# Patient Record
Sex: Female | Born: 1995 | Hispanic: No | Marital: Married
Health system: Southern US, Community
[De-identification: ages and names within clinical notes are randomized; demographics above are authoritative.]

## PROBLEM LIST (undated history)

## (undated) DIAGNOSIS — Q796 Ehlers-Danlos syndrome, unspecified: Secondary | ICD-10-CM

## (undated) DIAGNOSIS — N289 Disorder of kidney and ureter, unspecified: Secondary | ICD-10-CM

## (undated) DIAGNOSIS — G43909 Migraine, unspecified, not intractable, without status migrainosus: Secondary | ICD-10-CM

## (undated) DIAGNOSIS — I7300 Raynaud's syndrome without gangrene: Secondary | ICD-10-CM

## (undated) DIAGNOSIS — N200 Calculus of kidney: Secondary | ICD-10-CM

## (undated) DIAGNOSIS — E348 Other specified endocrine disorders: Secondary | ICD-10-CM

## (undated) DIAGNOSIS — Z9889 Other specified postprocedural states: Secondary | ICD-10-CM

## (undated) HISTORY — PX: KNEE ARTHROSCOPY: SHX127

## (undated) HISTORY — PX: TONSILLECTOMY: SUR1361

## (undated) HISTORY — PX: FOOT SURGERY: SHX648

## (undated) HISTORY — DX: Other specified postprocedural states: Z98.890

## (undated) HISTORY — PX: KNEE ARTHROSCOPY: SUR90

## (undated) HISTORY — PX: WISDOM TOOTH EXTRACTION: SHX21

---
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
## 2015-06-29 DIAGNOSIS — Q796 Ehlers-Danlos syndrome, unspecified: Secondary | ICD-10-CM | POA: Insufficient documentation

## 2015-08-09 DIAGNOSIS — M24662 Ankylosis, left knee: Secondary | ICD-10-CM | POA: Insufficient documentation

## 2015-08-09 DIAGNOSIS — M238X2 Other internal derangements of left knee: Secondary | ICD-10-CM | POA: Insufficient documentation

## 2015-08-09 DIAGNOSIS — M2392 Unspecified internal derangement of left knee: Secondary | ICD-10-CM | POA: Insufficient documentation

## 2016-02-22 DIAGNOSIS — R002 Palpitations: Secondary | ICD-10-CM | POA: Insufficient documentation

## 2016-03-21 DIAGNOSIS — H9312 Tinnitus, left ear: Secondary | ICD-10-CM | POA: Insufficient documentation

## 2016-06-13 DIAGNOSIS — M25562 Pain in left knee: Secondary | ICD-10-CM | POA: Insufficient documentation

## 2016-10-03 DIAGNOSIS — H9122 Sudden idiopathic hearing loss, left ear: Secondary | ICD-10-CM | POA: Insufficient documentation

## 2016-10-03 DIAGNOSIS — H60502 Unspecified acute noninfective otitis externa, left ear: Secondary | ICD-10-CM | POA: Insufficient documentation

## 2016-10-13 ENCOUNTER — Encounter (HOSPITAL_COMMUNITY): Payer: Self-pay

## 2016-10-13 DIAGNOSIS — H8110 Benign paroxysmal vertigo, unspecified ear: Secondary | ICD-10-CM | POA: Diagnosis not present

## 2016-10-13 DIAGNOSIS — I7300 Raynaud's syndrome without gangrene: Secondary | ICD-10-CM | POA: Diagnosis not present

## 2016-10-13 DIAGNOSIS — G43909 Migraine, unspecified, not intractable, without status migrainosus: Secondary | ICD-10-CM | POA: Insufficient documentation

## 2016-10-13 DIAGNOSIS — R42 Dizziness and giddiness: Secondary | ICD-10-CM | POA: Insufficient documentation

## 2016-10-13 LAB — I-STAT CHEM 8, ED
BUN: 10 mg/dL (ref 6–20)
CALCIUM ION: 1.18 mmol/L (ref 1.15–1.40)
CHLORIDE: 102 mmol/L (ref 101–111)
CREATININE: 0.6 mg/dL (ref 0.44–1.00)
Glucose, Bld: 117 mg/dL — ABNORMAL HIGH (ref 65–99)
HCT: 43 % (ref 36.0–46.0)
Hemoglobin: 14.6 g/dL (ref 12.0–15.0)
Potassium: 3.7 mmol/L (ref 3.5–5.1)
SODIUM: 139 mmol/L (ref 135–145)
TCO2: 25 mmol/L (ref 0–100)

## 2016-10-13 LAB — CBC WITH DIFFERENTIAL/PLATELET
Basophils Absolute: 0 10*3/uL (ref 0.0–0.1)
Basophils Relative: 0 %
EOS ABS: 0 10*3/uL (ref 0.0–0.7)
Eosinophils Relative: 0 %
HEMATOCRIT: 43.3 % (ref 36.0–46.0)
HEMOGLOBIN: 14.4 g/dL (ref 12.0–15.0)
LYMPHS ABS: 1.9 10*3/uL (ref 0.7–4.0)
LYMPHS PCT: 11 %
MCH: 30.4 pg (ref 26.0–34.0)
MCHC: 33.3 g/dL (ref 30.0–36.0)
MCV: 91.4 fL (ref 78.0–100.0)
MONOS PCT: 6 %
Monocytes Absolute: 1 10*3/uL (ref 0.1–1.0)
NEUTROS ABS: 14 10*3/uL — AB (ref 1.7–7.7)
NEUTROS PCT: 83 %
Platelets: 255 10*3/uL (ref 150–400)
RBC: 4.74 MIL/uL (ref 3.87–5.11)
RDW: 12.4 % (ref 11.5–15.5)
WBC: 16.9 10*3/uL — AB (ref 4.0–10.5)

## 2016-10-13 NOTE — ED Triage Notes (Signed)
Two weeks  Pt developed lt. Ear hearing loss and bleeding.  Pt. Went to ENT and they did not see anything wrong.  The pt. Has developed increased hearing loss, headache, dizziness and nausea.  Went to Dr. Jearld FentonByers today and he had no idea what was going on .  He is suppose to order an MRI and has not yet.  The symptoms became worse so the pt. Came here.  Skin is warm and dry.  Pt. Is alert and oriented X 4

## 2016-10-14 ENCOUNTER — Emergency Department (HOSPITAL_COMMUNITY): Payer: PRIVATE HEALTH INSURANCE

## 2016-10-14 ENCOUNTER — Emergency Department (HOSPITAL_COMMUNITY)
Admission: EM | Admit: 2016-10-14 | Discharge: 2016-10-14 | Disposition: A | Discharge status: Home / Self Care | Payer: PRIVATE HEALTH INSURANCE | Attending: Emergency Medicine | Admitting: Emergency Medicine

## 2016-10-14 DIAGNOSIS — G43909 Migraine, unspecified, not intractable, without status migrainosus: Secondary | ICD-10-CM

## 2016-10-14 DIAGNOSIS — H8110 Benign paroxysmal vertigo, unspecified ear: Secondary | ICD-10-CM

## 2016-10-14 HISTORY — DX: Calculus of kidney: N20.0

## 2016-10-14 HISTORY — DX: Other specified endocrine disorders: E34.8

## 2016-10-14 HISTORY — DX: Raynaud's syndrome without gangrene: I73.00

## 2016-10-14 HISTORY — DX: Ehlers-Danlos syndrome, unspecified: Q79.60

## 2016-10-14 LAB — URINALYSIS, ROUTINE W REFLEX MICROSCOPIC
BILIRUBIN URINE: NEGATIVE
Glucose, UA: NEGATIVE mg/dL
HGB URINE DIPSTICK: NEGATIVE
KETONES UR: NEGATIVE mg/dL
Leukocytes, UA: NEGATIVE
NITRITE: NEGATIVE
PH: 7 (ref 5.0–8.0)
Protein, ur: NEGATIVE mg/dL
Specific Gravity, Urine: 1.025 (ref 1.005–1.030)

## 2016-10-14 LAB — PREGNANCY, URINE: Preg Test, Ur: NEGATIVE

## 2016-10-14 MED ORDER — DIPHENHYDRAMINE HCL 50 MG/ML IJ SOLN
25.0000 mg | Freq: Once | INTRAMUSCULAR | Status: AC
  Administered 2016-10-14: 25 mg via INTRAVENOUS
  Filled 2016-10-14: qty 1

## 2016-10-14 MED ORDER — PROCHLORPERAZINE EDISYLATE 5 MG/ML IJ SOLN
10.0000 mg | Freq: Once | INTRAMUSCULAR | Status: AC
  Administered 2016-10-14: 10 mg via INTRAVENOUS
  Filled 2016-10-14: qty 2

## 2016-10-14 MED ORDER — SODIUM CHLORIDE 0.9 % IV BOLUS (SEPSIS)
1000.0000 mL | Freq: Once | INTRAVENOUS | Status: AC
  Administered 2016-10-14: 1000 mL via INTRAVENOUS

## 2016-10-14 MED ORDER — GADOBENATE DIMEGLUMINE 529 MG/ML IV SOLN
15.0000 mL | Freq: Once | INTRAVENOUS | Status: AC | PRN
  Administered 2016-10-14: 13 mL via INTRAVENOUS

## 2016-10-14 MED ORDER — MECLIZINE HCL 25 MG PO TABS: 25.0000 mg | ORAL_TABLET | Freq: Three times a day (TID) | ORAL | 0 refills | Status: DC | PRN

## 2016-10-14 NOTE — ED Notes (Signed)
Pt transported to MRI with mother to answer medical review questions.

## 2016-10-14 NOTE — ED Notes (Signed)
Assisted to the restroom  Urine collected

## 2016-10-14 NOTE — ED Provider Notes (Signed)
[facility] DE[facility] Provider Note   CSN: [id number] Arrival date & time: 10/13/16  1556     History   Chief Complaint Chief Complaint  Patient presents with  . Hearing Loss  . Dizziness  . Headache    HPI Debbie Christensen is a 21 y.o. female.  21 year old female with a history of Ehlers-Danlos presents to the emergency department for evaluation of dizziness. Patient has had constant dizziness over the past 2 weeks. She feels as though the room is spinning to her left. She has had intermittent nausea as well as sporadic vomiting. 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 She has noted an associated right parietal headache characterized as "someone stabbing me with an ice pick". Patient further reports hearing loss in her left ear. She denies any recent head injury or trauma. No history of similar migraines. She has not had associated fever, extremity numbness or paresthesias, extremity weakness. She has seen ENT with reassuring evaluations. She has trialed a course of steroids without improvement in her dizziness. Steroid course completed this morning.      Past Medical History:  Diagnosis Date  . Ehlers-Danlos disease   . Kidney calculi   . Pineal gland cyst   . Raynaud's disease     There are no active problems to display for this patient.   Past Surgical History:  Procedure Laterality Date  . FOOT SURGERY Right   . KNEE ARTHROSCOPY     6 bilateral knee surgeries  . TONSILLECTOMY    . WISDOM TOOTH EXTRACTION      OB History    No data available       Home Medications    Prior to Admission medications   Medication Sig Start Date End Date Taking? Authorizing Provider  meclizine (ANTIVERT) 25 MG tablet Take 1 tablet (25 mg total) by mouth 3 (three) times daily as needed for dizziness. 10/14/16   Antony Madura, PA-C    Family History No family history on file.  Social History Social History  Substance  Use Topics  . Smoking status: Smoker, Current Status Unknown  . Smokeless tobacco: Never Used  . Alcohol use No     Allergies   Tape   Review of Systems Review of Systems Ten systems reviewed and are negative for acute change, except as noted in the HPI.    Physical Exam Updated Vital Signs BP 106/72 (BP Location: Left Arm)   Pulse 66   Temp 98.9 F (37.2 C) (Oral)   Resp 16   Ht 5\' 4"  (1.626 m)   Wt 63.5 kg (140 lb)   LMP  (LMP Unknown)   SpO2 99%   BMI 24.03 kg/m   Physical Exam  Constitutional: She is oriented to person, place, and time. She appears well-developed and well-nourished. No distress.  Nontoxic and in NAD  HENT:  Head: Normocephalic and atraumatic.  Mouth/Throat: Oropharynx is clear and moist.  Symmetric rise of the uvula with phonation  Eyes: Conjunctivae and EOM are normal. Pupils are equal, round, and reactive to light. No scleral icterus.  Neck: Normal range of motion.  No meningismus  Cardiovascular: Normal rate, regular rhythm and intact distal pulses.   Pulmonary/Chest: Effort normal. No respiratory distress.  Musculoskeletal: Normal range of motion.  Neurological: She is alert and oriented to person, place, and time. No cranial nerve deficit. She exhibits normal muscle tone. Coordination normal.  GCS 15. Speech is goal oriented. No  cranial nerve deficits appreciated; symmetric eyebrow raise, no facial drooping, tongue midline. Patient has equal grip strength bilaterally with 5/5 strength against resistance in all major muscle groups bilaterally. Sensation to light touch intact. Patient moves extremities without ataxia.  Skin: Skin is warm and dry. No rash noted. She is not diaphoretic. No erythema. No pallor.  Psychiatric: She has a normal mood and affect. Her behavior is normal.  Nursing note and vitals reviewed.    ED Treatments / Results  Labs (all labs ordered are listed, but only abnormal results are displayed) Labs Reviewed  CBC WITH  DIFFERENTIAL/PLATELET - Abnormal; Notable for the following:       Result Value   WBC 16.9 (*)    Neutro Abs 14.0 (*)    All other components within normal limits  URINALYSIS, ROUTINE W REFLEX MICROSCOPIC - Abnormal; Notable for the following:    APPearance CLOUDY (*)    All other components within normal limits  I-STAT CHEM 8, ED - Abnormal; Notable for the following:    Glucose, Bld 117 (*)    All other components within normal limits  PREGNANCY, URINE    EKG  EKG Interpretation None       Radiology Mr Laqueta JeanBrain W And Wo Contrast  Result Date: 10/14/2016 CLINICAL DATA:  Initial evaluation for acute headache, dizziness. Hearing loss in left the ear. EXAM: MRI HEAD WITHOUT AND WITH CONTRAST TECHNIQUE: Multiplanar, multiecho pulse sequences of the brain and surrounding structures were obtained without and with intravenous contrast. CONTRAST:  13mL MULTIHANCE GADOBENATE DIMEGLUMINE 529 MG/ML IV SOLN COMPARISON:  None. FINDINGS: Brain: Cerebral volume within normal limits. No focal parenchymal signal abnormality. No abnormal foci of restricted diffusion to suggest acute or subacute ischemia. Gray-white matter differentiation maintained. No evidence for chronic infarction. No evidence for acute or chronic intracranial hemorrhage. No mass lesion, midline shift or mass effect. No hydrocephalus. No extra-axial fluid collection. Major dural sinuses are grossly patent. No abnormal enhancement. Pituitary gland within normal limits for age. Suprasellar region normal. Midline structures intact and normal. Vascular: Major intracranial vascular flow voids are well maintained and normal. Skull and upper cervical spine: Craniocervical junction within normal limits. Visualized upper cervical spine unremarkable. Bone marrow signal intensity within normal limits. No scalp soft tissue abnormality. Sinuses/Orbits: Globes and orbital soft tissues within normal limits. Paranasal sinuses are clear. No mastoid effusion.  Inner ear structures grossly normal. IMPRESSION: Normal brain MRI.  No acute intracranial process identified. Electronically Signed   By: Rise MuBenjamin  McClintock M.D.   On: 10/14/2016 04:19    Procedures Procedures (including critical care time)  Medications Ordered in ED Medications  sodium chloride 0.9 % bolus 1,000 mL (0 mLs Intravenous Stopped 10/14/16 0452)  prochlorperazine (COMPAZINE) injection 10 mg (10 mg Intravenous Given 10/14/16 0230)  diphenhydrAMINE (BENADRYL) injection 25 mg (25 mg Intravenous Given 10/14/16 0230)  gadobenate dimeglumine (MULTIHANCE) injection 15 mL (13 mLs Intravenous Contrast Given 10/14/16 0337)     Initial Impression / Assessment and Plan / ED Course  I have reviewed the triage vital signs and the nursing notes.  Pertinent labs & imaging results that were available during my care of the patient were reviewed by me and considered in my medical decision making (see chart for details).     21 year old female presents to the emergency department for evaluation of dizziness as well as headache and left-sided hearing loss. Dizziness has been persistent over the past 2 weeks. Patient also reports headache worse than prior migraines. The patient  has been followed by a neurologist previously. She is afebrile and without nuchal rigidity or meningismus. Neurologic exam nonfocal. No history of head injury or trauma.  MRI obtained which is negative for acute process. Patient has had improvement in her headache with migraine cocktail. Vitals stable. She also reports improvement in her dizziness. Given chronicity of symptoms, I do not believe further emergent workup is indicated. The patient is stable for discharge and follow-up with her primary care doctor as well as a neurologist. Meclizine prescribed for dizziness and return precautions given. Patient discharged in stable condition with no unaddressed concerns.  Vitals:   10/13/16 2044 10/14/16 0117 10/14/16 0359 10/14/16  0452  BP: 113/86 114/81 105/73 106/72  Pulse: 87 62 (!) 54 66  Resp: 20 12 16 16   Temp: 98.9 F (37.2 C)     TempSrc: Oral     SpO2: 98% 99% 98% 99%  Weight:      Height:        Final Clinical Impressions(s) / ED Diagnoses   Final diagnoses:  Migraine without status migrainosus, not intractable, unspecified migraine type  Benign paroxysmal positional vertigo, unspecified laterality    New Prescriptions New Prescriptions   MECLIZINE (ANTIVERT) 25 MG TABLET    Take 1 tablet (25 mg total) by mouth 3 (three) times daily as needed for dizziness.     Antony Madura, PA-C 10/14/16 0500    Palumbo, April, MD 10/14/16 [phone]

## 2016-10-14 NOTE — ED Notes (Signed)
Pt. currently at MRI .  

## 2016-10-15 ENCOUNTER — Other Ambulatory Visit: Payer: Self-pay | Admitting: Otolaryngology

## 2016-10-15 DIAGNOSIS — H905 Unspecified sensorineural hearing loss: Secondary | ICD-10-CM

## 2016-11-18 DIAGNOSIS — I7300 Raynaud's syndrome without gangrene: Secondary | ICD-10-CM | POA: Insufficient documentation

## 2017-01-20 DIAGNOSIS — G43009 Migraine without aura, not intractable, without status migrainosus: Secondary | ICD-10-CM | POA: Insufficient documentation

## 2017-02-24 DIAGNOSIS — E348 Other specified endocrine disorders: Secondary | ICD-10-CM | POA: Insufficient documentation

## 2017-05-05 DIAGNOSIS — M5116 Intervertebral disc disorders with radiculopathy, lumbar region: Secondary | ICD-10-CM | POA: Insufficient documentation

## 2017-05-15 DIAGNOSIS — M546 Pain in thoracic spine: Secondary | ICD-10-CM | POA: Insufficient documentation

## 2017-12-30 ENCOUNTER — Other Ambulatory Visit: Payer: Self-pay

## 2017-12-30 ENCOUNTER — Ambulatory Visit (INDEPENDENT_AMBULATORY_CARE_PROVIDER_SITE_OTHER): Payer: PRIVATE HEALTH INSURANCE

## 2017-12-30 ENCOUNTER — Ambulatory Visit (INDEPENDENT_AMBULATORY_CARE_PROVIDER_SITE_OTHER): Payer: PRIVATE HEALTH INSURANCE | Admitting: Podiatry

## 2017-12-30 ENCOUNTER — Encounter: Payer: Self-pay | Admitting: Podiatry

## 2017-12-30 VITALS — BP 109/80 | HR 124

## 2017-12-30 DIAGNOSIS — M76822 Posterior tibial tendinitis, left leg: Secondary | ICD-10-CM

## 2017-12-30 DIAGNOSIS — M7750 Other enthesopathy of unspecified foot: Secondary | ICD-10-CM

## 2017-12-30 DIAGNOSIS — M779 Enthesopathy, unspecified: Secondary | ICD-10-CM | POA: Diagnosis not present

## 2017-12-30 MED ORDER — MELOXICAM 15 MG PO TABS: 15.0000 mg | ORAL_TABLET | Freq: Every day | ORAL | 1 refills | Status: AC

## 2017-12-30 NOTE — Progress Notes (Signed)
    HPI: 22 year old female presenting today as a new patient with a chief complaint of sharp, stabbing pain to the left ankle that began 1.5 weeks ago. Walking increases the pain. She has not done anything for treatment. Patient is here for further evaluation and treatment.   Past Medical History:  Diagnosis Date  . Ehlers-Danlos disease   . Kidney calculi   . Pineal gland cyst   . Raynaud's disease        Physical Exam: General: The patient is alert and oriented x3 in no acute distress.  Dermatology: Skin is warm, dry and supple bilateral lower extremities. Negative for open lesions or macerations.  Vascular: Palpable pedal pulses bilaterally. No edema or erythema noted. Capillary refill within normal limits.  Neurological: Epicritic and protective threshold grossly intact bilaterally.   Musculoskeletal Exam: Pain on palpation noted to the posterior tibial tendon of the left foot. Range of motion within normal limits. Muscle strength 5/5 in all muscle groups bilateral lower extremities.  Radiographic Exam:  Normal osseous mineralization. Joint spaces preserved. No fracture or dislocation identified.    Assessment: 1. Posterior tibial tendinitis left   Plan of Care:  1. Patient was evaluated. Radiographs were reviewed today. 2. Injection of 0.5 mL Celestone Soluspan injected into the posterior tibial tendon sheath.  3. CAM boot dispensed. Weightbearing as tolerated.  4. Prescription for Meloxicam provided to patient.  5. Compression anklet dispensed.  6. Return to clinic in 3 weeks.   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    Felecia ShellingBrent M. Phillip Maffei, DPM [facility]  Dr. Felecia ShellingBrent M. Kyndall Amero, DPM    [address]                                        [place], [place] [address]                Office [phone]  Fax [phone]

## 2018-01-08 ENCOUNTER — Ambulatory Visit: Payer: PRIVATE HEALTH INSURANCE | Admitting: Podiatry

## 2018-01-13 ENCOUNTER — Other Ambulatory Visit: Payer: Self-pay | Admitting: Podiatry

## 2018-01-13 ENCOUNTER — Telehealth: Payer: Self-pay | Admitting: *Deleted

## 2018-01-13 DIAGNOSIS — T148XXA Other injury of unspecified body region, initial encounter: Secondary | ICD-10-CM

## 2018-01-13 DIAGNOSIS — M76822 Posterior tibial tendinitis, left leg: Secondary | ICD-10-CM

## 2018-01-13 MED ORDER — METHYLPREDNISOLONE 4 MG PO TBPK: ORAL_TABLET | ORAL | 0 refills | Status: DC

## 2018-01-13 NOTE — Telephone Encounter (Signed)
-----   Message from Felecia Shelling, DPM sent at 01/13/2018 12:25 PM EDT ----- Regarding: MRI left ankle Please order MRI left ankle w/out contrast.   Dx: posterior tibial tendon rupture left.  Thanks, Dr. Logan Bores

## 2018-01-13 NOTE — Telephone Encounter (Signed)
Orders to J. Quintana, RN for pre-cert, faxed to [facility]. 

## 2018-01-13 NOTE — Progress Notes (Signed)
PT tendinitis left  Rx Medrol dose pak

## 2018-01-27 ENCOUNTER — Ambulatory Visit: Payer: PRIVATE HEALTH INSURANCE

## 2018-01-27 ENCOUNTER — Telehealth: Payer: Self-pay | Admitting: Podiatry

## 2018-01-27 ENCOUNTER — Ambulatory Visit: Payer: PRIVATE HEALTH INSURANCE | Admitting: Podiatry

## 2018-01-27 DIAGNOSIS — M76822 Posterior tibial tendinitis, left leg: Secondary | ICD-10-CM

## 2018-01-27 NOTE — Telephone Encounter (Signed)
Pt has an Mri order at [facility] for this Friday. she got a letter in the mail yesterday asking was the MRI was medically necessary. Dr. Logan Bores would need to call the insurance company to do the peer to peer for a reconsideration.

## 2018-01-27 NOTE — Telephone Encounter (Signed)
Pt presented to office with denial paperwork from [facility] [employer], stating PEER TO PEER [id number] with in 14 days of the denial on 01/22/2018.

## 2018-01-27 NOTE — Telephone Encounter (Signed)
I informed pt, we were waiting for the correspondence from her insurance company, with the reason for denial and how to proceed with the reconsideration. Pt states she will bring that to the office.

## 2018-01-27 NOTE — Telephone Encounter (Signed)
I told pt I had process the reconsideration information for Dr. Logan Bores to call for PEER TO PEER and not to have the MRI until we had informed of the PA. Pt states understanding.

## 2018-01-27 NOTE — Telephone Encounter (Signed)
Pt called asked if we were able to process the insurance because she is scheduled for Friday.

## 2018-01-28 NOTE — Telephone Encounter (Signed)
Dr. Logan Bores states he reviewed the insurance correspondence concerning MRI denial, feels pt will benefit from 6 wks of PT and conservative therapy prior to the MRI.

## 2018-01-28 NOTE — Telephone Encounter (Signed)
[facility] Pre-cert [facility] states pt is scheduled for MRI Friday and needs pre-cert. 11:53am - I informed Melanie - [facility] pt had elected to accept Dr. Logan Bores recommendation for conservation therapy x 6 weeks to include PT, and to cancel MRI 01/29/2018.

## 2018-01-28 NOTE — Telephone Encounter (Signed)
Left message informing pt, Dr. Logan Bores had reviewed the insurance correspondence and had orders for her.

## 2018-01-28 NOTE — Addendum Note (Signed)
Addended by: Alphia Kava D on: 01/28/2018 11:11 AM   Modules accepted: Orders

## 2018-01-28 NOTE — Telephone Encounter (Signed)
I asked Debbie Christensen - [facility] to cance MRI 01/29/2018.

## 2018-01-28 NOTE — Telephone Encounter (Addendum)
I informed pt of Dr. Logan Bores review of insurance correspondence and orders, pt agreed. Pt states she would like to be referred to [facility] - Michaela Corner, PT. Faxed required form with note "Attn:  Pt request Michaela Corner, and demographics to [employer].

## 2018-01-29 ENCOUNTER — Encounter (HOSPITAL_COMMUNITY): Payer: Self-pay

## 2018-01-29 ENCOUNTER — Ambulatory Visit (HOSPITAL_COMMUNITY)
Admission: RE | Admit: 2018-01-29 | Discharge: 2018-01-29 | Disposition: A | Discharge status: Home / Self Care | Payer: PRIVATE HEALTH INSURANCE | Source: Ambulatory Visit | Attending: Podiatry | Admitting: Podiatry

## 2018-02-03 ENCOUNTER — Telehealth: Payer: Self-pay

## 2018-02-10 ENCOUNTER — Ambulatory Visit: Payer: PRIVATE HEALTH INSURANCE | Admitting: Podiatry

## 2018-02-22 ENCOUNTER — Ambulatory Visit (HOSPITAL_COMMUNITY): Payer: PRIVATE HEALTH INSURANCE

## 2018-02-22 ENCOUNTER — Encounter (HOSPITAL_COMMUNITY): Payer: Self-pay

## 2018-02-22 ENCOUNTER — Ambulatory Visit (HOSPITAL_COMMUNITY)
Admission: RE | Admit: 2018-02-22 | Discharge: 2018-02-22 | Disposition: A | Discharge status: Home / Self Care | Payer: PRIVATE HEALTH INSURANCE | Source: Ambulatory Visit | Attending: Obstetrics and Gynecology | Admitting: Obstetrics and Gynecology

## 2018-02-22 DIAGNOSIS — Q796 Ehlers-Danlos syndrome, unspecified: Secondary | ICD-10-CM | POA: Diagnosis not present

## 2018-02-22 DIAGNOSIS — Z3169 Encounter for other general counseling and advice on procreation: Secondary | ICD-10-CM | POA: Diagnosis not present

## 2018-02-22 DIAGNOSIS — Z315 Encounter for genetic counseling: Secondary | ICD-10-CM

## 2018-02-22 HISTORY — DX: Migraine, unspecified, not intractable, without status migrainosus: G43.909

## 2018-02-22 NOTE — Assessment & Plan Note (Signed)
Consultation:   Debbie Christensen is a 22 yo Caucasian female, G 0  P 0  LMP patient does not have menses due to continuous lo Estrin OCPs currently not pregnant here for pre-conceptual counseling at Dr. Kendra Ross' request secondary to:    1) Ehlers-Danlos Syndrome  PREVIOUS OBSTETRICAL HISTORY:  None     PREVIOUS GYN HISTORY:   Abnormal PAP - neg   GC - neg   Chlamydia - neg    Syphilis - neg   CAT - 11 x 28-30 x 7   Contraception - continuous Lo Estrin due to bleeding and cramping    PREVIOUS MEDICAL HISTORY:  DM - neg   HTN - neg   Asthma - neg   Thyroid - neg   Rheumatic Fever - neg    Heart - neg   Lung - neg   Liver - neg   Kidney - Age 18 - Nephrolithiasis   Epilepsy - neg    TB - neg   Herpes - neg   UTI - neg   Age 16 - Raynaud's Syndrome     PREVIOUS SURGICAL HISTORY:  1) Age 7 - Tonsillectomy without complications 2) 2012 - 2018 - Knee surgeries x 6 (left) one (right) without complications 3) 2014 - Right foot surgery with removal of accessory navicular bone without complications 4) 2014 - Wisdom teeth extraction without complications    MEDICATIONS:  Lo Estrin, Nortriptyline 100 mg QD       ALLERGIES/REACTIONS:  Toradol - neurologic symptoms, aphasia, tunnel vision      HABITS:  Smoking - neg   Drinking - neg   Drugs - neg    PSYCHOSOCIAL:  Engaged      PROFESSION:  Musical Therapist    FAMILY HISTORY:  DM - GF   HTN - neg   Twins - neg   Stillborns - neg    Birth Defects - neg   Mental Retardation - neg   Blood Dyscrasias - neg   Anesthesia Complications - neg    Genetic - neg          EHLERS-DANLOS SYNDROME:  Ehlers-Danlos syndrome (EDS) (also known as "Cutis hyperelastica") is a group of inherited connective tissue disorders, caused by a defect in the synthesis of collagen (a protein in connective tissue). The collagen in connective tissue helps tissues to resist deformation (decreases its elasticity). In the skin, muscles, ligaments,  blood vessels, and visceral organs collagen plays a very significant role and with increased elasticity, secondary to abnormal collagen, pathology results. Depending on the individual mutation, the severity of the syndrome can vary from mild to life-threatening. There is no cure and treatment is supportive, including close monitoring of cardiovascular system.  Mutations in the following can cause Ehlers-Danlos syndrome: . Fibrous proteins: COL1A1, COL1A2, COL3A1, COL5A1, COL5A2, and TNXB  . Enzymes: ADAMTS2, PLOD1  Mutations in these genes usually alter the structure, production, or processing of collagen or proteins that interact with collagen. Collagen provides structure and strength to connective tissue throughout the body. cA defect in collagen can weaken connective tissue in the skin, bones, blood vessels, and organs, resulting in the features of the disorder. Inheritance patterns depend on the type of Ehlers-Danlos syndrome. Most forms of the condition are inherited in an autosomal dominant pattern, which means only one of the two copies of the gene in question must be altered to cause the disorder. The minority are inherited in an autosomal recessive pattern, which means both copies of   the gene must be altered for a person to be affected by the condition. It can also be an individual (de novo or "sporadic") mutation.   Debbie Christensen has the Classical form of EDS. Classical EDS (cEDS)  Classical EDS is the second most-common form of the condition after Hypermobile EDS which represents 80-90 percent of all cases of Ehlers-Danlos. It was formerly known as Ehlers-Danlos Type 1 or 2This genetic disorder presents itself with extremes of hypermobile joints and stretchy and fragile skin.  The condition is inherited in an autosomal dominant fashion Additional symptoms of cEDS include: . Atrophic scarring . Significant bruising . Abnormal wound healing . Tissue fragility may result in hernias, rectal prolapse  and other complications . Cardiovascular abnormalities such as mitral valve prolapse or aortic root dilatation may occur . Pregnancy may be complicated by premature rupture of membranes   IMPRESSIONS:  1) 22 yo Caucasian female G0 Po with Classical Ehlers-Danlos Syndrome    RECOMMENDATIONS:  1) Maternal ECHO - in the absence of aortic root dilatation, pregnancy should be allowed.  Vaginal delivery would be preferred secondary to the issues of poor wound healing and hernia formation.  Uterine rupture is not associated with this type of Ehlers-Danlos.            45 minutes spent in face-to-face consultation with greater than 50% of the time spent in counseling.    Thank you for utilizing our ultrasound and consultative services.  If I may be of any further service, please do not hesitate to contact me.  Sincerely,   Jatavius Ellenwood L. Jennfier Abdulla, MD Maternal-Fetal Medicine   Copy of report sent to practitioner/clinic.  

## 2018-02-22 NOTE — ED Notes (Signed)
Pt in for preconception consult.  BP 109/72, p 100, weight 153.2 lb. Pt in with Dr. Perry MountJacobson.

## 2018-02-22 NOTE — Consult Note (Signed)
Consultation:   Debbie Christensen is a 22 yo Caucasian female, G 0  P 0  LMP patient does not have menses due to continuous lo Estrin OCPs currently not pregnant here for pre-conceptual counseling at Dr. Waynard Reeds' request secondary to:    1) Ehlers-Danlos Syndrome  PREVIOUS OBSTETRICAL HISTORY:  None     PREVIOUS GYN HISTORY:   Abnormal PAP - neg   GC - neg   Chlamydia - neg    Syphilis - neg   CAT - 11 x 28-30 x 7   Contraception - continuous Lo Estrin due to bleeding and cramping    PREVIOUS MEDICAL HISTORY:  DM - neg   HTN - neg   Asthma - neg   Thyroid - neg   Rheumatic Fever - neg    Heart - neg   Lung - neg   Liver - neg   Kidney - Age 60 - Nephrolithiasis   Epilepsy - neg    TB - neg   Herpes - neg   UTI - neg   Age 68 - Raynaud's Syndrome     PREVIOUS SURGICAL HISTORY:  1) Age 38 - Tonsillectomy without complications 2) 2012 - 2018 - Knee surgeries x 6 (left) one (right) without complications 3) 2014 - Right foot surgery with removal of accessory navicular bone without complications 4) 2014 - Wisdom teeth extraction without complications    MEDICATIONS:  Lo Estrin, Nortriptyline 100 mg QD       ALLERGIES/REACTIONS:  Toradol - neurologic symptoms, aphasia, tunnel vision      HABITS:  Smoking - neg   Drinking - neg   Drugs - neg    PSYCHOSOCIAL:  Engaged      PROFESSION:  Musical Therapist    FAMILY HISTORY:  DM - GF   HTN - neg   Twins - neg   Stillborns - neg    Birth Defects - neg   Mental Retardation - neg   Blood Dyscrasias - neg   Anesthesia Complications - neg    Genetic - neg          EHLERS-DANLOS SYNDROME:  Ehlers-Danlos syndrome (EDS) (also known as "Cutis hyperelastica") is a group of inherited connective tissue disorders, caused by a defect in the synthesis of collagen (a protein in connective tissue). The collagen in connective tissue helps tissues to resist deformation (decreases its elasticity). In the skin, muscles, ligaments,  blood vessels, and visceral organs collagen plays a very significant role and with increased elasticity, secondary to abnormal collagen, pathology results. Depending on the individual mutation, the severity of the syndrome can vary from mild to life-threatening. There is no cure and treatment is supportive, including close monitoring of cardiovascular system.  Mutations in the following can cause Ehlers-Danlos syndrome: . Fibrous proteins: COL1A1, COL1A2, COL3A1, COL5A1, COL5A2, and TNXB  . Enzymes: ADAMTS2, PLOD1  Mutations in these genes usually alter the structure, production, or processing of collagen or proteins that interact with collagen. Collagen provides structure and strength to connective tissue throughout the body. cA defect in collagen can weaken connective tissue in the skin, bones, blood vessels, and organs, resulting in the features of the disorder. Inheritance patterns depend on the type of Ehlers-Danlos syndrome. Most forms of the condition are inherited in an autosomal dominant pattern, which means only one of the two copies of the gene in question must be altered to cause the disorder. The minority are inherited in an autosomal recessive pattern, which means both copies of  the gene must be altered for a person to be affected by the condition. It can also be an individual (de novo or "sporadic") mutation.   Debbie Christensen has the Classical form of EDS. Classical EDS (cEDS)  Classical EDS is the second most-common form of the condition after Hypermobile EDS which represents 80-90 percent of all cases of Ehlers-Danlos. It was formerly known as Ehlers-Danlos Type 1 or 2This genetic disorder presents itself with extremes of hypermobile joints and stretchy and fragile skin.  The condition is inherited in an autosomal dominant fashion Additional symptoms of cEDS include: . Atrophic scarring . Significant bruising . Abnormal wound healing . Tissue fragility may result in hernias, rectal prolapse  and other complications . Cardiovascular abnormalities such as mitral valve prolapse or aortic root dilatation may occur . Pregnancy may be complicated by premature rupture of membranes   IMPRESSIONS:  1) 22 yo Caucasian female G0 Po with Classical Ehlers-Danlos Syndrome    RECOMMENDATIONS:  1) Maternal ECHO - in the absence of aortic root dilatation, pregnancy should be allowed.  Vaginal delivery would be preferred secondary to the issues of poor wound healing and hernia formation.  Uterine rupture is not associated with this type of Ehlers-Danlos.            45 minutes spent in face-to-face consultation with greater than 50% of the time spent in counseling.    Thank you for utilizing our ultrasound and consultative services.  If I may be of any further service, please do not hesitate to contact me.  Sincerely,   Patsi Searsobert L. , MD Maternal-Fetal Medicine   Copy of report sent to practitioner/clinic.

## 2018-09-10 MED FILL — NORTRIPTYLINE HCL CAP 25 MG: 25 | 30 days supply | Qty: 120 | Fill #0

## 2018-10-04 MED FILL — NORTRIPTYLINE HCL CAP 25 MG: 25 | 30 days supply | Qty: 120 | Fill #0

## 2018-10-06 ENCOUNTER — Emergency Department (HOSPITAL_COMMUNITY): Payer: No Typology Code available for payment source

## 2018-10-06 ENCOUNTER — Other Ambulatory Visit: Payer: Self-pay

## 2018-10-06 ENCOUNTER — Encounter (HOSPITAL_COMMUNITY): Payer: Self-pay | Admitting: Emergency Medicine

## 2018-10-06 ENCOUNTER — Emergency Department (HOSPITAL_COMMUNITY)
Admission: EM | Admit: 2018-10-06 | Discharge: 2018-10-07 | Disposition: A | Discharge status: Home / Self Care | Payer: No Typology Code available for payment source | Attending: Emergency Medicine | Admitting: Emergency Medicine

## 2018-10-06 DIAGNOSIS — Z79899 Other long term (current) drug therapy: Secondary | ICD-10-CM | POA: Diagnosis not present

## 2018-10-06 DIAGNOSIS — R200 Anesthesia of skin: Secondary | ICD-10-CM | POA: Diagnosis not present

## 2018-10-06 DIAGNOSIS — M25551 Pain in right hip: Secondary | ICD-10-CM | POA: Diagnosis not present

## 2018-10-06 LAB — I-STAT BETA HCG BLOOD, ED (MC, WL, AP ONLY): I-stat hCG, quantitative: <5 m[IU]/mL (ref <5)

## 2018-10-06 NOTE — ED Triage Notes (Signed)
Patient complaining of right side hip dislocation. Patient has EDS. Patient states that it popped out.

## 2018-10-07 MED ORDER — HYDROCODONE-ACETAMINOPHEN 5-325 MG PO TABS
2.0000 | ORAL_TABLET | Freq: Once | ORAL | Status: AC
  Administered 2018-10-07: 02:00:00 2 via ORAL
  Filled 2018-10-07: qty 2

## 2018-10-07 MED ORDER — HYDROCODONE-ACETAMINOPHEN 5-325 MG PO TABS: 1.0000 | ORAL_TABLET | Freq: Four times a day (QID) | ORAL | 0 refills | Status: DC | PRN

## 2018-10-07 MED FILL — HYDROCODON-APAP 5-325: 5-325 | 2 days supply | Qty: 5 | Fill #0

## 2018-10-07 NOTE — Discharge Instructions (Addendum)
Please use the knee immobilizer you have every day.  Please use the crutches every day and do not bear weight on your right leg.  Please follow-up with orthopedic doctor in 1 week

## 2018-10-07 NOTE — ED Notes (Signed)
EDP at bedside  

## 2018-10-07 NOTE — ED Provider Notes (Signed)
[facility] Provider Note   CSN: [id number] Arrival date & time: 10/06/18  2137     History   Chief Complaint Chief Complaint  Patient presents with  . Hip Injury    HPI Debbie Christensen is a 23 y.o. female.     The history is provided by the patient.  Hip Pain This is a new problem. The current episode started yesterday. The problem occurs constantly. The problem has not changed since onset.The symptoms are aggravated by walking. The symptoms are relieved by rest.   Patient with history of Ehlers-Danlos disease. She reports she dislocated her right hip while walking.  No falls or traumatic injury.  She reports this is happened before with other joints due to her disease.  She reports she did relocate the hip. She reports pain down her right leg and some numbness in her right foot Past Medical History:  Diagnosis Date  . Ehlers-Danlos disease   . Kidney calculi   . Migraines   . Pineal gland cyst   . Raynaud's disease     Patient Active Problem List   Diagnosis Date Noted  . Encounter for procreative genetic counseling and testing 02/22/2018  . Thoracic back pain 05/15/2017  . Lumbar disc herniation with radiculopathy 05/05/2017  . Pineal gland cyst 02/24/2017  . Migraine without aura and without status migrainosus, not intractable 01/20/2017  . Raynaud's disease 11/18/2016  . Dizziness 10/13/2016  . Acute noninfective otitis externa of left ear 10/03/2016  . Sudden left hearing loss 10/03/2016  . Acute pain of left knee 06/13/2016  . Tinnitus of left ear 03/21/2016  . Palpitations 02/22/2016  . Arthrofibrosis of knee joint, left 08/09/2015  . Chondral defect of left patella 08/09/2015  . Patellar malalignment syndrome, left 08/09/2015  . Ehlers-Danlos syndrome 06/29/2015  . Blurred vision, bilateral 09/19/2013    Past Surgical History:  Procedure Laterality Date  . FOOT SURGERY Right   . KNEE ARTHROSCOPY     6 bilateral knee  surgeries  . TONSILLECTOMY    . WISDOM TOOTH EXTRACTION       OB History   No obstetric history on file.      Home Medications    Prior to Admission medications   Medication Sig Start Date End Date Taking? Authorizing Provider  LO LOESTRIN FE 1 MG-10 MCG / 10 MCG tablet Take 1 tablet by mouth daily.  12/08/17  Yes [provider]  HYDROcodone-acetaminophen (NORCO/VICODIN) 5-325 MG tablet Take 1 tablet by mouth every 6 (six) hours as needed for severe pain. 10/07/18   Zadie RhineWickline, Derrika Ruffalo, MD  nortriptyline (PAMELOR) 25 MG capsule Take 75 mg by mouth at bedtime.  12/04/17   [provider]    Family History History reviewed. No pertinent family history.  Social History Social History   Tobacco Use  . Smoking status: Never Smoker  . Smokeless tobacco: Never Used  Substance Use Topics  . Alcohol use: No  . Drug use: No     Allergies   Ketorolac tromethamine and Tape   Review of Systems Review of Systems  Constitutional: Negative for fever.  Musculoskeletal: Positive for arthralgias.  Neurological: Positive for numbness.  All other systems reviewed and are negative.    Physical Exam Updated Vital Signs BP 115/75   Pulse 96   Temp 98.7 F (37.1 C) (Oral)   Resp 18   Ht 1.626 m (5\' 4" )   Wt 68 kg   SpO2 100%   BMI  25.75 kg/m   Physical Exam CONSTITUTIONAL: Well developed/well nourished HEAD: Normocephalic/atraumatic EYES: EOMI ENMT: Mucous membranes moist NECK: supple no meningeal signs CV: S1/S2 noted, no murmurs/rubs/gallops noted LUNGS: Lungs are clear to auscultation bilaterally, no apparent distress ABDOMEN: soft, nontender NEURO: Pt is awake/alert/appropriate, moves all extremitiesx4.  No facial droop.   EXTREMITIES: pulses normal/equal, full ROM, no deformities to right lower extremity, distal pulses equal intact, no tenderness to right knee/right ankle/right foot.  She has tenderness with range of motion of right hip.  No edema noted  to right lower extremity She is able to plantar/dorsi flex right foot SKIN: warm, color normal PSYCH: no abnormalities of mood noted, alert and oriented to situation   ED Treatments / Results  Labs (all labs ordered are listed, but only abnormal results are displayed) Labs Reviewed  I-STAT BETA HCG BLOOD, ED ([facility], [facility], [facility] ONLY)    EKG None  Radiology Dg Hip Unilat W Or Wo Pelvis 2-3 Views Right  Result Date: 10/06/2018 CLINICAL DATA:  Hip dislocation EXAM: DG HIP (WITH OR WITHOUT PELVIS) 2-3V RIGHT COMPARISON:  None. FINDINGS: There is no evidence of hip fracture or dislocation. There is no evidence of arthropathy or other focal bone abnormality. IMPRESSION: Negative. Electronically Signed   By: Rolm Baptise M.D.   On: 10/06/2018 23:27    Procedures Procedures   Medications Ordered in ED Medications  HYDROcodone-acetaminophen (NORCO/VICODIN) 5-325 MG per tablet 2 tablet (2 tablets Oral Given 10/07/18 0155)     Initial Impression / Assessment and Plan / ED Course  I have reviewed the triage vital signs and the nursing notes.  Pertinent  imaging results that were available during my care of the patient were reviewed by me and considered in my medical decision making (see chart for details).        Imaging is negative.  Patient has continued pain in the right leg.  Distal pulses intact.  She reports residual numbness to the right foot, but she does have strength in the foot. Plan is to place in a knee immobilizer, crutches and nonweightbearing until seen orthopedics.  She reports she has both a knee immobilizer and crutches at home.  Will discharge home with pain medications and also advise to [facility]  Final Clinical Impressions(s) / ED Diagnoses   Final diagnoses:  Pain of right hip joint    ED Discharge Orders         Ordered    HYDROcodone-acetaminophen (NORCO/VICODIN) 5-325 MG tablet  Every 6 hours PRN     10/07/18 0207           Ripley Fraise, MD 10/07/18  0236

## 2018-10-15 MED FILL — NORTRIPTYLINE HCL CAP 25 MG: 25 | 30 days supply | Qty: 120 | Fill #0

## 2018-10-21 MED FILL — LO LOESTRIN FE 1-10 TABLET: 1 MG-10 MCG | 84 days supply | Qty: 84 | Fill #0

## 2018-10-26 ENCOUNTER — Other Ambulatory Visit: Payer: Self-pay

## 2018-10-26 DIAGNOSIS — Z20822 Contact with and (suspected) exposure to covid-19: Secondary | ICD-10-CM

## 2018-10-27 MED FILL — NORTRIPTYLINE HCL CAP 25 MG: 25 | 30 days supply | Qty: 120 | Fill #0

## 2018-10-28 LAB — NOVEL CORONAVIRUS, NAA: SARS-CoV-2, NAA: NOT DETECTED

## 2018-11-08 MED FILL — BLISOVI FE 1/20 1-20 MG-MCG: 1-20 | 28 days supply | Qty: 28 | Fill #0

## 2018-11-10 MED FILL — SULFAMETHOXAZOLE-TMP SS TAB: 400-80 | 5 days supply | Qty: 10 | Fill #0

## 2018-11-20 MED FILL — NORTRIPTYLINE HCL CAP 25 MG: 25 | 30 days supply | Qty: 120 | Fill #1

## 2018-12-03 MED FILL — LO LOESTRIN FE 1-10 TABLET: 1 MG-10 MCG | 84 days supply | Qty: 84 | Fill #0

## 2018-12-21 MED FILL — NORTRIPTYLINE HCL CAP 25 MG: 25 | 30 days supply | Qty: 120 | Fill #2

## 2018-12-31 MED FILL — NORTRIPTYLINE HCL CAP 25 MG: 25 | 30 days supply | Qty: 120 | Fill #2

## 2019-01-25 MED FILL — METHOCARBAMOL 500 MG TABS: 500 | 5 days supply | Qty: 40 | Fill #0

## 2019-01-25 MED FILL — CEPHALEXIN 500 MG CAPSULE: 500 | 3 days supply | Qty: 12 | Fill #0

## 2019-01-25 MED FILL — ONDANSETRON HCL 4 MG TABLET: 4 | 13 days supply | Qty: 50 | Fill #0

## 2019-01-25 MED FILL — oxyCODONE HCL 5 MG TABS: 5 | 7 days supply | Qty: 40 | Fill #0

## 2019-01-27 MED FILL — CYCLOBENZAPRINE HCL 10 MG T: 10 | 13 days supply | Qty: 40 | Fill #0

## 2019-01-27 MED FILL — HYDROmorphone HCL 2 MG TABS: 2 | 5 days supply | Qty: 40 | Fill #0

## 2019-01-31 ENCOUNTER — Other Ambulatory Visit (HOSPITAL_COMMUNITY): Payer: Self-pay | Admitting: Specialist

## 2019-01-31 ENCOUNTER — Other Ambulatory Visit: Payer: Self-pay

## 2019-01-31 ENCOUNTER — Ambulatory Visit (HOSPITAL_BASED_OUTPATIENT_CLINIC_OR_DEPARTMENT_OTHER)
Admission: RE | Admit: 2019-01-31 | Discharge: 2019-01-31 | Disposition: A | Discharge status: Home / Self Care | Payer: No Typology Code available for payment source | Source: Ambulatory Visit | Attending: Specialist | Admitting: Specialist

## 2019-01-31 ENCOUNTER — Emergency Department (HOSPITAL_COMMUNITY): Payer: No Typology Code available for payment source

## 2019-01-31 ENCOUNTER — Encounter (HOSPITAL_COMMUNITY): Payer: Self-pay | Admitting: Emergency Medicine

## 2019-01-31 ENCOUNTER — Emergency Department (HOSPITAL_COMMUNITY)
Admission: EM | Admit: 2019-01-31 | Discharge: 2019-01-31 | Disposition: A | Discharge status: Home / Self Care | Payer: No Typology Code available for payment source | Attending: Emergency Medicine | Admitting: Emergency Medicine

## 2019-01-31 DIAGNOSIS — M7989 Other specified soft tissue disorders: Secondary | ICD-10-CM

## 2019-01-31 DIAGNOSIS — I82441 Acute embolism and thrombosis of right tibial vein: Secondary | ICD-10-CM

## 2019-01-31 DIAGNOSIS — M79604 Pain in right leg: Secondary | ICD-10-CM

## 2019-01-31 DIAGNOSIS — R000 Tachycardia, unspecified: Secondary | ICD-10-CM | POA: Diagnosis not present

## 2019-01-31 DIAGNOSIS — I82491 Acute embolism and thrombosis of other specified deep vein of right lower extremity: Secondary | ICD-10-CM | POA: Diagnosis not present

## 2019-01-31 HISTORY — DX: Ehlers-Danlos syndrome, unspecified: Q79.60

## 2019-01-31 HISTORY — DX: Disorder of kidney and ureter, unspecified: N28.9

## 2019-01-31 LAB — CBC
HCT: 40.1 % (ref 36.0–46.0)
Hemoglobin: 12.8 g/dL (ref 12.0–15.0)
MCH: 29.4 pg (ref 26.0–34.0)
MCHC: 31.9 g/dL (ref 30.0–36.0)
MCV: 92.2 fL (ref 80.0–100.0)
Platelets: 304 10*3/uL (ref 150–400)
RBC: 4.35 MIL/uL (ref 3.87–5.11)
RDW: 11.7 % (ref 11.5–15.5)
WBC: 7.1 10*3/uL (ref 4.0–10.5)
nRBC: 0 % (ref 0.0–0.2)

## 2019-01-31 LAB — BASIC METABOLIC PANEL
Anion gap: 9 (ref 5–15)
BUN: 13 mg/dL (ref 6–20)
CO2: 27 mmol/L (ref 22–32)
Calcium: 9.3 mg/dL (ref 8.9–10.3)
Chloride: 102 mmol/L (ref 98–111)
Creatinine, Ser: 0.58 mg/dL (ref 0.44–1.00)
GFR calc Af Amer: >60 mL/min (ref >60)
GFR calc non Af Amer: >60 mL/min (ref >60)
Glucose, Bld: 106 mg/dL — ABNORMAL HIGH (ref 70–99)
Potassium: 4 mmol/L (ref 3.5–5.1)
Sodium: 138 mmol/L (ref 135–145)

## 2019-01-31 MED ORDER — RIVAROXABAN (XARELTO) VTE STARTER PACK (15 & 20 MG): ORAL_TABLET | ORAL | 0 refills | Status: DC

## 2019-01-31 MED ORDER — RIVAROXABAN 15 MG PO TABS
15.0000 mg | ORAL_TABLET | Freq: Once | ORAL | Status: AC
  Administered 2019-01-31: 15 mg via ORAL
  Filled 2019-01-31: qty 1

## 2019-01-31 MED ORDER — RIVAROXABAN (XARELTO) EDUCATION KIT FOR DVT/PE PATIENTS
PACK | Freq: Once | Status: AC
  Administered 2019-01-31: 18:00:00
  Filled 2019-01-31: qty 1

## 2019-01-31 MED ORDER — SODIUM CHLORIDE 0.9 % IV BOLUS
1000.0000 mL | Freq: Once | INTRAVENOUS | Status: AC
  Administered 2019-01-31: 1000 mL via INTRAVENOUS

## 2019-01-31 MED ORDER — SODIUM CHLORIDE (PF) 0.9 % IJ SOLN
INTRAMUSCULAR | Status: AC
  Filled 2019-01-31: qty 50

## 2019-01-31 MED ORDER — IOHEXOL 350 MG/ML SOLN
100.0000 mL | Freq: Once | INTRAVENOUS | Status: AC | PRN
  Administered 2019-01-31: 100 mL via INTRAVENOUS

## 2019-01-31 MED ORDER — HYDROMORPHONE HCL 1 MG/ML IJ SOLN
1.0000 mg | Freq: Once | INTRAMUSCULAR | Status: AC
  Administered 2019-01-31: 1 mg via INTRAVENOUS
  Filled 2019-01-31: qty 1

## 2019-01-31 MED FILL — GABAPENTIN 300 MG CAPSULE: 300 | 30 days supply | Qty: 60 | Fill #0

## 2019-01-31 NOTE — Progress Notes (Signed)
Lower extremity venous has been completed.   Preliminary results in CV Proc.   Abram Sander 01/31/2019 2:10 PM

## 2019-01-31 NOTE — ED Provider Notes (Signed)
[facility] DEPT Provider Note   CSN: [id number] Arrival date & time: 01/31/19  1427     History   Chief Complaint Chief Complaint  Patient presents with   DVT    HPI Debbie Christensen is a 23 y.o. female with a hx of ehler's danlos syndrome, raynaud's disease, & recent R knee surgery 01/25/19 by Dr. Theda Sers who presents to the ED due to positive RLE DVT study outpatient today.  Patient states that following her surgery she has had pain to the right knee, it then started radiating to the calf/ankle area.  Pain is constant, worse with movement, mildly alleviated by hydromorphone but she is taking orally as needed. She thinks there may be some mild associated swelling. Given her symptoms she was scheduled for an outpatient ultrasound today which came back positive for DVT and she was sent to the emergency department.  She denies numbness, tingling, or weakness.  Denies redness/drainage from her surgical incision site, fever, or chills.  She denies chest pain, dyspnea, hemoptysis, recent recent long travel,  personal hx of cancer, or hx of DVT/PE. She takes OCPs. Denies hx of prior GI bleed or bleeding issues.   HPI  Past Medical History:  Diagnosis Date   EDS (Ehlers-Danlos syndrome)    Raynaud's disease    Renal disorder     There are no active problems to display for this patient.   Past Surgical History:  Procedure Laterality Date   KNEE ARTHROSCOPY     TONSILLECTOMY       OB History   No obstetric history on file.      Home Medications    Prior to Admission medications   Not on File    Family History No family history on file.  Social History Social History   Tobacco Use   Smoking status: Never Smoker  Substance Use Topics   Alcohol use: Never    Frequency: Never   Drug use: Never     Allergies   Adhesive [tape], Cinnamon, and Toradol [ketorolac tromethamine]   Review of Systems Review of Systems    Constitutional: Negative for chills and fever.  Respiratory: Negative for shortness of breath.   Cardiovascular: Positive for leg swelling. Negative for chest pain.  Gastrointestinal: Negative for abdominal pain, nausea and vomiting.  Musculoskeletal: Positive for myalgias.  Neurological: Negative for weakness and numbness.  All other systems reviewed and are negative.  Physical Exam Updated Vital Signs BP 124/85    Pulse (!) 107    Temp 98.4 F (36.9 C) (Oral)    Resp 20    Ht '5\' 4"'$  (1.626 m)    Wt 71.2 kg    SpO2 99%    BMI 26.95 kg/m   Physical Exam Vitals signs and nursing note reviewed.  Constitutional:      General: She is not in acute distress.    Appearance: She is not ill-appearing or toxic-appearing.  HENT:     Head: Normocephalic and atraumatic.  Neck:     Musculoskeletal: Neck supple.  Cardiovascular:     Rate and Rhythm: Regular rhythm. Tachycardia present.     Pulses: Normal pulses.          Dorsalis pedis pulses are 2+ on the right side and 2+ on the left side.       Posterior tibial pulses are 2+ on the right side and 2+ on the left side.     Heart sounds: Normal heart sounds.  Pulmonary:  Effort: Pulmonary effort is normal.     Breath sounds: Normal breath sounds.  Abdominal:     General: There is no distension.     Palpations: Abdomen is soft.     Tenderness: There is no abdominal tenderness.  Musculoskeletal:     Comments: Lower extremities: R anterior knee- surgical site w/ sutures in place, no significant erythema/warmth/drainage. R knee & R lower leg mildly swollen, no significant pitting edema.. Patient has intact AROM to bilateral hips/ankles, R knee ROM deferred secondary to immobilization per orthopedic surgery team. Tender to palpation to the R knee & the R calf diffusely. NVI distally. Compartments are soft.   Skin:    General: Skin is warm and dry.     Capillary Refill: Capillary refill takes less than 2 seconds.  Neurological:     Mental  Status: She is alert.     Comments: Alert. Clear speech. Sensation grossly intact to bilateral lower extremities. 5/5 strength with plantar/dorsiflexion bilaterally.  Psychiatric:        Mood and Affect: Mood normal.        Behavior: Behavior normal.    ED Treatments / Results  Labs (all labs ordered are listed, but only abnormal results are displayed) Labs Reviewed  BASIC METABOLIC PANEL - Abnormal; Notable for the following components:      Result Value   Glucose, Bld 106 (*)    All other components within normal limits  CBC    EKG None  Radiology Ct Angio Chest Pe W/cm &/or Wo Cm  Result Date: 01/31/2019 CLINICAL DATA:  Leg swelling and pain, known DVT in the right leg, tachycardia EXAM: CT ANGIOGRAPHY CHEST WITH CONTRAST TECHNIQUE: Multidetector CT imaging of the chest was performed using the standard protocol during bolus administration of intravenous contrast. Multiplanar CT image reconstructions and MIPs were obtained to evaluate the vascular anatomy. CONTRAST:  117m OMNIPAQUE IOHEXOL 350 MG/ML SOLN COMPARISON:  None. FINDINGS: Cardiovascular: Satisfactory opacification of the pulmonary arteries to the segmental level. No pulmonary artery filling defects are identified. Central pulmonary arteries are normal caliber. Insert normal aorta shared origin of the brachiocephalic and left common carotid artery. Proximal great vessels are unremarkable. Normal heart size. No pericardial effusion. Slight compressive mass effect on the right heart by a mild pectus deformity of the chest (Haller index 2.66). No CT evidence of right heart strain with a normal RV/LV ratio (0.53). Mediastinum/Nodes: No enlarged mediastinal, hilar or axillary lymph nodes. Thyroid gland, trachea, and esophagus demonstrate no significant findings. Lungs/Pleura: Dependent atelectasis posteriorly. No consolidation, features of edema, pneumothorax, or effusion. No suspicious pulmonary nodules or masses. Upper Abdomen: No  acute abnormalities present in the visualized portions of the upper abdomen. Musculoskeletal: No acute osseous abnormality or suspicious osseous lesion. Review of the MIP images confirms the above findings. IMPRESSION: No pulmonary artery embolism.  No acute intrathoracic process. Pectus deformity of the chest with slight compressive mass effect on the right heart. Electronically Signed   By: PLovena LeM.D.   On: 01/31/2019 17:20   Vas [place] Extremity Venous (dvt)  Result Date: 01/31/2019  Lower Venous Study Indications: Swelling, and Pain.  Comparison Study: no prior Performing Technologist: MAbram SanderRVS  Examination Guidelines: A complete evaluation includes B-mode imaging, spectral Doppler, color Doppler, and power Doppler as needed of all accessible portions of each vessel. Bilateral testing is considered an integral part of a complete examination. Limited examinations for reoccurring indications may be performed as noted.  +---------+---------------+---------+-----------+----------+--------------+  RIGHT  Compressibility Phasicity Spontaneity Properties Thrombus Aging  +---------+---------------+---------+-----------+----------+--------------+  CFV       Full            Yes       Yes                                    +---------+---------------+---------+-----------+----------+--------------+  SFJ       Full                                                             +---------+---------------+---------+-----------+----------+--------------+  FV Prox   Full                                                             +---------+---------------+---------+-----------+----------+--------------+  FV Mid    Full                                                             +---------+---------------+---------+-----------+----------+--------------+  FV Distal Full                                                             +---------+---------------+---------+-----------+----------+--------------+  PFV        Full                                                             +---------+---------------+---------+-----------+----------+--------------+  POP       Full            Yes       Yes                                    +---------+---------------+---------+-----------+----------+--------------+  PTV       None                                             Acute           +---------+---------------+---------+-----------+----------+--------------+  PERO                                                       Not visualized  +---------+---------------+---------+-----------+----------+--------------+   +----+---------------+---------+-----------+----------+--------------+  LEFT Compressibility Phasicity Spontaneity Properties Thrombus Aging  +----+---------------+---------+-----------+----------+--------------+  CFV                                                   Not visualized  +----+---------------+---------+-----------+----------+--------------+     Summary: Left: Findings consistent with acute deep vein thrombosis involving the left posterior tibial veins. No cystic structure found in the popliteal fossa.  *See table(s) above for measurements and observations.    Preliminary     Procedures Procedures (including critical care time)  Medications Ordered in ED Medications  rivaroxaban (XARELTO) Education Kit for DVT/PE patients (has no administration in time range)  sodium chloride (PF) 0.9 % injection (has no administration in time range)  Rivaroxaban (XARELTO) tablet 15 mg (has no administration in time range)  HYDROmorphone (DILAUDID) injection 1 mg (1 mg Intravenous Given 01/31/19 1607)  sodium chloride 0.9 % bolus 1,000 mL (0 mLs Intravenous Stopped 01/31/19 1659)  iohexol (OMNIPAQUE) 350 MG/ML injection 100 mL (100 mLs Intravenous Contrast Given 01/31/19 1651)     Initial Impression / Assessment and Plan / ED Course  I have reviewed the triage vital signs and the nursing notes.  Pertinent  labs & imaging results that were available during my care of the patient were reviewed by me and considered in my medical decision making (see chart for details).   Patient with recent right knee surgical procedure about a week ago presents to the emergency department due to outpatient venous duplex ultrasound of the right lower extremity being positive for posterior tibial vein DVT. Nontoxic appearing, resting comfortably, vitals notable for tachycardia. RLE exam w/o erythema/warmth- does not appear consistent w/ infectious process (septic joint/cellulitis). Mild swelling & TTP to the R knee/R lower leg. NVI distally. Exam not consistent w/ cerulea dolens/albicans. Given tachycardia basic labs & CTA was obtained to r/o PE- no PE. No acute intrathoracic process. Tachycardia improved/normalized. Appears appropriate for discharge home on Xarelto. Pharmacy consulted to provide patient education & coupon. I also personally discussed risks/benefits of medicine & reasons to return to the ER right away. I discussed results, treatment plan, need for follow-up, and return precautions with the patient & her husband @ bedside. Provided opportunity for questions, patient & her husband confirmed understanding and are in agreement with plan.   Findings and plan of care discussed with supervising physician Dr. Roslynn Amble who is in agreement.   Final Clinical Impressions(s) / ED Diagnoses   Final diagnoses:  Acute deep vein thrombosis (DVT) of tibial vein of right lower extremity [facility])    ED Discharge Orders         Ordered    Rivaroxaban 15 & 20 MG TBPK     01/31/19 1751           Amaryllis Dyke, PA-C 01/31/19 1808    Lucrezia Starch, MD 02/01/19 [phone]

## 2019-01-31 NOTE — ED Notes (Signed)
Patient transported to CT 

## 2019-01-31 NOTE — ED Triage Notes (Addendum)
Pt sent post vascular for positive clot to right calf.   Pt had right knee surgery on Tuesday.   Denies CP or SOB.

## 2019-01-31 NOTE — Discharge Instructions (Addendum)
You are seen in the emergency department today for a blood clot in your leg.  Your CT scan did not show a blood clot in your chest.  Your labs were reassuring.  We are sending you home with Xarelto, a blood thinner, to treat the clot.  Please take this as prescribed.  Please see the attached handout.  As discussed should you hit your head, get into an accident, have blood in your stool/urine, have uncontrollable bleeding, or any other concerns return to the ER immediately.  Please follow-up with your surgeon and your primary care provider within 3 days, return to the ER immediately for new or worsening symptoms including but not limited to chest pain, trouble breathing, worsening pain, numbness, tingling, increased swelling, or any other concerns.  Information on my medicine - XARELTO (rivaroxaban)  This medication education was reviewed with me or my healthcare representative as part of my discharge preparation.  The pharmacist that spoke with me during my hospital stay was:  Albino Bufford A, [place]? Xarelto was prescribed to treat blood clots that may have been found in the veins of your legs (deep vein thrombosis) or in your lungs (pulmonary embolism) and to reduce the risk of them occurring again.  What do you need to know about Xarelto? The starting dose is one 15 mg tablet taken TWICE daily with food for the FIRST 21 DAYS then on (enter date)  02/22/2019  the dose is changed to one 20 mg tablet taken ONCE A DAY with your evening meal.  DO NOT stop taking Xarelto without talking to the health care provider who prescribed the medication.  Refill your prescription for 20 mg tablets before you run out.  After discharge, you should have regular check-up appointments with your healthcare provider that is prescribing your Xarelto.  In the future your dose may need to be changed if your kidney function changes by a significant amount.  What do you do if you  miss a dose? If you are taking Xarelto TWICE DAILY and you miss a dose, take it as soon as you remember. You may take two 15 mg tablets (total 30 mg) at the same time then resume your regularly scheduled 15 mg twice daily the next day.  If you are taking Xarelto ONCE DAILY and you miss a dose, take it as soon as you remember on the same day then continue your regularly scheduled once daily regimen the next day. Do not take two doses of Xarelto at the same time.   Important Safety Information Xarelto is a blood thinner medicine that can cause bleeding. You should call your healthcare provider right away if you experience any of the following: ? Bleeding from an injury or your nose that does not stop. ? Unusual colored urine (red or dark brown) or unusual colored stools (red or black). ? Unusual bruising for unknown reasons. ? A serious fall or if you hit your head (even if there is no bleeding).  Some medicines may interact with Xarelto and might increase your risk of bleeding while on Xarelto. To help avoid this, consult your healthcare provider or pharmacist prior to using any new prescription or non-prescription medications, including herbals, vitamins, non-steroidal anti-inflammatory drugs (NSAIDs) and supplements.  This website has more information on Xarelto: [email or web address]

## 2019-01-31 NOTE — ED Notes (Signed)
Patient given water

## 2019-02-01 ENCOUNTER — Encounter (HOSPITAL_COMMUNITY): Payer: Self-pay | Admitting: Emergency Medicine

## 2019-02-01 MED FILL — XARELTO STARTER PACK: 15 & 20 | 30 days supply | Qty: 51 | Fill #0

## 2019-02-07 MED FILL — traMADol HCL 50 MG TABS: 50 | 10 days supply | Qty: 40 | Fill #0

## 2019-02-07 MED FILL — CYCLOBENZAPRINE HCL 10 MG T: 10 | 13 days supply | Qty: 40 | Fill #0

## 2019-02-25 MED FILL — XARELTO 20 MG TABLET: 20 | 30 days supply | Qty: 30 | Fill #0

## 2019-02-28 MED FILL — LO LOESTRIN FE 1-10 TABLET: 1 MG-10 MCG | 84 days supply | Qty: 84 | Fill #1

## 2019-03-30 MED FILL — XARELTO 20 MG TABLET: 20 | 30 days supply | Qty: 30 | Fill #1

## 2019-04-15 MED FILL — GABAPENTIN 300 MG CAPSULE: 300 | 90 days supply | Qty: 180 | Fill #0

## 2019-04-25 MED FILL — XARELTO 20 MG TABLET: 20 | 30 days supply | Qty: 30 | Fill #2

## 2019-05-24 ENCOUNTER — Encounter (HOSPITAL_COMMUNITY): Payer: Self-pay

## 2019-05-24 ENCOUNTER — Ambulatory Visit (HOSPITAL_COMMUNITY)
Admission: EM | Admit: 2019-05-24 | Discharge: 2019-05-24 | Disposition: A | Discharge status: Home / Self Care | Payer: No Typology Code available for payment source | Attending: Family Medicine | Admitting: Family Medicine

## 2019-05-24 ENCOUNTER — Other Ambulatory Visit: Payer: Self-pay

## 2019-05-24 ENCOUNTER — Ambulatory Visit (INDEPENDENT_AMBULATORY_CARE_PROVIDER_SITE_OTHER): Payer: No Typology Code available for payment source

## 2019-05-24 DIAGNOSIS — S42402A Unspecified fracture of lower end of left humerus, initial encounter for closed fracture: Secondary | ICD-10-CM | POA: Diagnosis not present

## 2019-05-24 NOTE — ED Provider Notes (Signed)
[facility]    CSN: [id number] Arrival date & time: 05/24/19  0820      History   Chief Complaint Chief Complaint  Patient presents with  . Fall  . Arm Injury    HPI Pasha Gadison is a 24 y.o. female.   HPI   Patient is here for arm pain.  She slipped and fell last night on hardwood floors.  She landed over on her left elbow.  She states that it is swollen, painful, and unable to move more than just a small amount.  No numbness or weakness into the hand.  No pain in other joints.  She states that she thinks she "bruised" her hip.  She is able to ambulate without pain. Patient has underlying Ehlers-Danlos syndrome.  She has never had problems with her bowels or fractures.  Past Medical History:  Diagnosis Date  . EDS (Ehlers-Danlos syndrome)   . Ehlers-Danlos disease   . Kidney calculi   . Migraines   . Pineal gland cyst   . Raynaud's disease   . Renal disorder     Patient Active Problem List   Diagnosis Date Noted  . Encounter for procreative genetic counseling and testing 02/22/2018  . Thoracic back pain 05/15/2017  . Lumbar disc herniation with radiculopathy 05/05/2017  . Pineal gland cyst 02/24/2017  . Migraine without aura and without status migrainosus, not intractable 01/20/2017  . Raynaud's disease 11/18/2016  . Dizziness 10/13/2016  . Acute noninfective otitis externa of left ear 10/03/2016  . Sudden left hearing loss 10/03/2016  . Acute pain of left knee 06/13/2016  . Tinnitus of left ear 03/21/2016  . Palpitations 02/22/2016  . Arthrofibrosis of knee joint, left 08/09/2015  . Chondral defect of left patella 08/09/2015  . Patellar malalignment syndrome, left 08/09/2015  . Ehlers-Danlos syndrome 06/29/2015  . Blurred vision, bilateral 09/19/2013    Past Surgical History:  Procedure Laterality Date  . FOOT SURGERY Right   . KNEE ARTHROSCOPY     6 bilateral knee surgeries  . KNEE ARTHROSCOPY    . TONSILLECTOMY    . WISDOM TOOTH  EXTRACTION      OB History   No obstetric history on file.      Home Medications    Prior to Admission medications   Medication Sig Start Date End Date Taking? Authorizing Provider  LO LOESTRIN FE 1 MG-10 MCG / 10 MCG tablet Take 1 tablet by mouth daily.  12/08/17   [provider]  nortriptyline (PAMELOR) 25 MG capsule Take 75 mg by mouth at bedtime.  12/04/17   [provider]  Rivaroxaban 15 & 20 MG TBPK Follow package directions: Take one 15mg  tablet by mouth twice a day. On day 22, switch to one 20mg  tablet once a day. Take with food. 01/31/19 05/24/19  Petrucelli, 02/02/19, PA-C    Family History Family History  Problem Relation Age of Onset  . Healthy Mother   . Thyroid disease Father     Social History Social History   Tobacco Use  . Smoking status: Never Smoker  . Smokeless tobacco: Never Used  Substance Use Topics  . Alcohol use: Never  . Drug use: Never     Allergies   Cinnamon, Ketorolac, and Tape   Review of Systems Review of Systems  Musculoskeletal: Positive for arthralgias.     Physical Exam Triage Vital Signs ED Triage Vitals  Enc Vitals Group     BP 05/24/19 0835 113/82  Pulse Rate 05/24/19 0835 (!) 106     Resp 05/24/19 0835 16     Temp 05/24/19 0835 97.9 F (36.6 C)     Temp Source 05/24/19 0835 Oral     SpO2 05/24/19 0835 96 %     Weight --      Height --      Head Circumference --      Peak Flow --      Pain Score 05/24/19 0836 8     Pain Loc --      Pain Edu? --      Excl. in [place]? --    No data found.  Updated Vital Signs BP 113/82 (BP Location: Left Arm)   Pulse (!) 106   Temp 97.9 F (36.6 C) (Oral)   Resp 16   SpO2 96%        Physical Exam Constitutional:      General: She is not in acute distress.    Appearance: Normal appearance. She is well-developed.     Comments: Appears uncomfortable.  Holds left arm close to body  HENT:     Head: Normocephalic and atraumatic.     Mouth/Throat:      Comments: Mask in place Eyes:     Conjunctiva/sclera: Conjunctivae normal.     Pupils: Pupils are equal, round, and reactive to light.  Cardiovascular:     Rate and Rhythm: Normal rate and regular rhythm.     Comments: By observation Pulmonary:     Effort: Pulmonary effort is normal. No respiratory distress.  Musculoskeletal:        General: Swelling, tenderness and signs of injury present. No deformity. Normal range of motion.     Cervical back: Normal range of motion.     Comments: Left upper extremity is examined.  Shoulder is nontender.  Limited range of motion secondary to elbow pain.  Elbow is tender over the olecranon process, medial and lateral epicondyles.  Patient resists any movement of elbow.  Wrist and hand exam is normal.  Compared with right upper extremity.  Skin:    General: Skin is warm and dry.  Neurological:     General: No focal deficit present.     Mental Status: She is alert.  Psychiatric:        Mood and Affect: Mood normal.     Comments: Pleasant, cooperative      UC Treatments / Results  Labs (all labs ordered are listed, but only abnormal results are displayed) Labs Reviewed - No data to display  EKG   Radiology DG Elbow Complete Left  Result Date: 05/24/2019 CLINICAL DATA:  Fall, elbow pain EXAM: LEFT ELBOW - COMPLETE 3+ VIEW COMPARISON:  None. FINDINGS: There is a left elbow joint effusion. No visible fracture, but concern for occult fracture. Joint spaces maintained. Soft tissues unremarkable. IMPRESSION: Left elbow joint effusion without visible fracture, but findings are concerning for occult fracture. Consider immobilization and repeat imaging in 1 week if symptoms persist. Electronically Signed   By: Rolm Baptise M.D.   On: 05/24/2019 09:14    Procedures Procedures (including critical care time)  Medications Ordered in UC Medications - No data to display  Initial Impression / Assessment and Plan / UC Course  I have reviewed the triage  vital signs and the nursing notes.  Pertinent labs & imaging results that were available during my care of the patient were reviewed by me and considered in my medical decision making (see chart for details).  Discussed with Dr Shon Baton of [facility].  He recommends patient be seen today and referred to their walk in clinic at noon Final Clinical Impressions(s) / UC Diagnoses   Final diagnoses:  Closed fracture of left elbow, initial encounter     Discharge Instructions     Go to your ortho office at noon    ED Prescriptions    None     PDMP not reviewed this encounter.   Eustace Moore, MD 05/24/19 1030

## 2019-05-24 NOTE — Discharge Instructions (Addendum)
Go to your ortho office at noon

## 2019-05-24 NOTE — ED Triage Notes (Signed)
Pt states she slipped on hardwood floors at home last night. Landed on left side. C/o left elbow/forearm pain and left hip pain. Denies head injury or LOC. Pt keeps left arm in "sling position" for comfort. +2 left radial pulse, neurovascular intact to hand/fingers.  Pt states she took home pregnancy test yesterday (negative) 2/2 nausea.

## 2019-05-26 MED FILL — LO LOESTRIN FE 1-10 TABLET: 1 MG-10 MCG | 84 days supply | Qty: 84 | Fill #2

## 2019-06-24 ENCOUNTER — Ambulatory Visit: Payer: No Typology Code available for payment source | Attending: Internal Medicine

## 2019-06-24 DIAGNOSIS — Z23 Encounter for immunization: Secondary | ICD-10-CM

## 2019-06-24 NOTE — Progress Notes (Signed)
   Covid-19 Vaccination Clinic  Name:  Debbie Christensen    MRN: [id number] DOB: 1996-01-15  06/24/2019  Debbie Christensen was observed post Covid-19 immunization for 30 minutes based on pre-vaccination screening without incident. She was provided with Vaccine Information Sheet and instruction to access the V-Safe system.   Debbie Christensen was instructed to call 911 with any severe reactions post vaccine: Marland Kitchen Difficulty breathing  . Swelling of face and throat  . A fast heartbeat  . A bad rash all over body  . Dizziness and weakness   Immunizations Administered    Name Date Dose VIS Date Route   Pfizer COVID-19 Vaccine 06/24/2019  9:01 AM 0.3 mL 03/18/2019 Intramuscular   Manufacturer: [employer], [employer]   Lot: [id number]   NDC: [address]-5

## 2019-06-25 ENCOUNTER — Ambulatory Visit: Payer: No Typology Code available for payment source

## 2019-07-20 ENCOUNTER — Ambulatory Visit: Payer: No Typology Code available for payment source | Attending: Internal Medicine

## 2019-07-20 DIAGNOSIS — Z23 Encounter for immunization: Secondary | ICD-10-CM

## 2019-07-20 NOTE — Progress Notes (Signed)
   Covid-19 Vaccination Clinic  Name:  Debbie Christensen    MRN: [id number] DOB: 03/26/1996  07/20/2019  [name] was observed post Covid-19 immunization for 15 minutes without incident. She was provided with Vaccine Information Sheet and instruction to access the V-Safe system.   [name] was instructed to call 911 with any severe reactions post vaccine: Marland Kitchen Difficulty breathing  . Swelling of face and throat  . A fast heartbeat  . A bad rash all over body  . Dizziness and weakness   Immunizations Administered    Name Date Dose VIS Date Route   Pfizer COVID-19 Vaccine 07/20/2019 11:16 AM 0.3 mL 03/18/2019 Intramuscular   Manufacturer: [employer], [employer]   Lot: [id number]   NDC: [address]-7

## 2019-11-02 ENCOUNTER — Emergency Department (HOSPITAL_COMMUNITY): Payer: No Typology Code available for payment source

## 2019-11-02 ENCOUNTER — Emergency Department (HOSPITAL_COMMUNITY)
Admission: EM | Admit: 2019-11-02 | Discharge: 2019-11-03 | Disposition: A | Discharge status: Home / Self Care | Payer: No Typology Code available for payment source | Attending: Emergency Medicine | Admitting: Emergency Medicine

## 2019-11-02 ENCOUNTER — Other Ambulatory Visit: Payer: Self-pay

## 2019-11-02 ENCOUNTER — Encounter (HOSPITAL_COMMUNITY): Payer: Self-pay

## 2019-11-02 DIAGNOSIS — R072 Precordial pain: Secondary | ICD-10-CM | POA: Insufficient documentation

## 2019-11-02 LAB — I-STAT BETA HCG BLOOD, ED (NOT ORDERABLE): I-stat hCG, quantitative: <5 m[IU]/mL (ref <5)

## 2019-11-02 LAB — BASIC METABOLIC PANEL
Anion gap: 9 (ref 5–15)
BUN: 17 mg/dL (ref 6–20)
CO2: 24 mmol/L (ref 22–32)
Calcium: 9.4 mg/dL (ref 8.9–10.3)
Chloride: 106 mmol/L (ref 98–111)
Creatinine, Ser: 0.61 mg/dL (ref 0.44–1.00)
GFR calc Af Amer: >60 mL/min (ref >60)
GFR calc non Af Amer: >60 mL/min (ref >60)
Glucose, Bld: 88 mg/dL (ref 70–99)
Potassium: 3.5 mmol/L (ref 3.5–5.1)
Sodium: 139 mmol/L (ref 135–145)

## 2019-11-02 LAB — CBC
HCT: 40.7 % (ref 36.0–46.0)
Hemoglobin: 13.3 g/dL (ref 12.0–15.0)
MCH: 29.2 pg (ref 26.0–34.0)
MCHC: 32.7 g/dL (ref 30.0–36.0)
MCV: 89.5 fL (ref 80.0–100.0)
Platelets: 230 10*3/uL (ref 150–400)
RBC: 4.55 MIL/uL (ref 3.87–5.11)
RDW: 11.5 % (ref 11.5–15.5)
WBC: 10.3 10*3/uL (ref 4.0–10.5)
nRBC: 0 % (ref 0.0–0.2)

## 2019-11-02 LAB — TROPONIN I (HIGH SENSITIVITY): Troponin I (High Sensitivity): <2 ng/L (ref <18)

## 2019-11-02 LAB — D-DIMER, QUANTITATIVE: D-Dimer, Quant: 0.37 ug/mL-FEU (ref 0.00–0.50)

## 2019-11-02 MED ORDER — SODIUM CHLORIDE 0.9% FLUSH: 3.0000 mL | Freq: Once | INTRAVENOUS | Status: DC

## 2019-11-02 MED ORDER — IOHEXOL 350 MG/ML SOLN
100.0000 mL | Freq: Once | INTRAVENOUS | Status: AC | PRN
  Administered 2019-11-02: 100 mL via INTRAVENOUS

## 2019-11-02 NOTE — ED Notes (Signed)
Patient transported to CT 

## 2019-11-02 NOTE — ED Triage Notes (Signed)
Pt complains of being lightheaded and having a stabbing pain in her chest especially when trying to take a deep breath She states that it feels like she can't get enough air  Pt had a DVT in October and was on blood thinners for three months Pt is vaccinated

## 2019-11-03 ENCOUNTER — Encounter (HOSPITAL_COMMUNITY): Payer: Self-pay | Admitting: Emergency Medicine

## 2019-11-03 MED ORDER — NAPROXEN 375 MG PO TABS
375.0000 mg | ORAL_TABLET | Freq: Once | ORAL | Status: AC
  Administered 2019-11-03: 375 mg via ORAL
  Filled 2019-11-03: qty 1

## 2019-11-03 MED ORDER — LIDOCAINE 5 % EX PTCH: 1.0000 | MEDICATED_PATCH | CUTANEOUS | 0 refills | Status: AC

## 2019-11-03 MED ORDER — ALUM & MAG HYDROXIDE-SIMETH 200-200-20 MG/5ML PO SUSP
30.0000 mL | Freq: Once | ORAL | Status: AC
  Administered 2019-11-03: 30 mL via ORAL
  Filled 2019-11-03: qty 30

## 2019-11-03 MED FILL — LIDOCAINE 5 % PTCH: 5 | 30 days supply | Qty: 30 | Fill #0

## 2019-11-03 NOTE — ED Provider Notes (Signed)
[facility]-EMERGENCY DEPT Provider Note   CSN: [id number] Arrival date & time: 11/02/19  2150     History No chief complaint on file.   Debbie Christensen is a 24 y.o. female.  The history is provided by the patient.  Chest Pain Pain location:  Substernal area Pain quality: sharp   Pain radiates to:  Does not radiate Pain severity:  Moderate Onset quality:  Gradual Duration:  3 days Timing:  Constant Progression:  Unchanged Chronicity:  New Context: breathing   Relieved by:  Nothing Worsened by:  Nothing Ineffective treatments:  None tried Associated symptoms: no abdominal pain, no anorexia, no anxiety, no back pain, no cough, no diaphoresis, no dizziness, no fever, no lower extremity edema, no near-syncope, no palpitations, no shortness of breath, no syncope and no weakness   Risk factors: no aortic disease        Past Medical History:  Diagnosis Date  . EDS (Ehlers-Danlos syndrome)   . Ehlers-Danlos disease   . Kidney calculi   . Migraines   . Pineal gland cyst   . Raynaud's disease   . Renal disorder     Patient Active Problem List   Diagnosis Date Noted  . Encounter for procreative genetic counseling and testing 02/22/2018  . Thoracic back pain 05/15/2017  . Lumbar disc herniation with radiculopathy 05/05/2017  . Pineal gland cyst 02/24/2017  . Migraine without aura and without status migrainosus, not intractable 01/20/2017  . Raynaud's disease 11/18/2016  . Dizziness 10/13/2016  . Acute noninfective otitis externa of left ear 10/03/2016  . Sudden left hearing loss 10/03/2016  . Acute pain of left knee 06/13/2016  . Tinnitus of left ear 03/21/2016  . Palpitations 02/22/2016  . Arthrofibrosis of knee joint, left 08/09/2015  . Chondral defect of left patella 08/09/2015  . Patellar malalignment syndrome, left 08/09/2015  . Ehlers-Danlos syndrome 06/29/2015  . Blurred vision, bilateral 09/19/2013    Past Surgical History:  Procedure  Laterality Date  . FOOT SURGERY Right   . KNEE ARTHROSCOPY     6 bilateral knee surgeries  . KNEE ARTHROSCOPY    . TONSILLECTOMY    . WISDOM TOOTH EXTRACTION       OB History   No obstetric history on file.     Family History  Problem Relation Age of Onset  . Healthy Mother   . Thyroid disease Father     Social History   Tobacco Use  . Smoking status: Never Smoker  . Smokeless tobacco: Never Used  Vaping Use  . Vaping Use: Never used  Substance Use Topics  . Alcohol use: Never  . Drug use: Never    Home Medications Prior to Admission medications   Medication Sig Start Date End Date Taking? Authorizing Provider  LO LOESTRIN FE 1 MG-10 MCG / 10 MCG tablet Take 1 tablet by mouth daily.  12/08/17  Yes [provider]  Rivaroxaban 15 & 20 MG TBPK Follow package directions: Take one 15mg  tablet by mouth twice a day. On day 22, switch to one 20mg  tablet once a day. Take with food. 01/31/19 05/24/19  Petrucelli, 02/02/19, PA-C    Allergies    Cinnamon, Ketorolac, and Tape  Review of Systems   Review of Systems  Constitutional: Negative for diaphoresis and fever.  HENT: Negative for congestion.   Eyes: Negative for visual disturbance.  Respiratory: Negative for cough and shortness of breath.   Cardiovascular: Positive for chest pain. Negative for palpitations, syncope and  near-syncope.  Gastrointestinal: Negative for abdominal pain and anorexia.  Genitourinary: Negative for difficulty urinating.  Musculoskeletal: Negative for back pain.  Skin: Negative for rash.  Neurological: Negative for dizziness and weakness.  Psychiatric/Behavioral: Negative for agitation.  All other systems reviewed and are negative.   Physical Exam Updated Vital Signs BP (!) 128/101   Pulse 70   Temp 97.6 F (36.4 C) (Oral)   Resp 13   Ht 5\' 4"  (1.626 m)   Wt 72.6 kg   LMP 11/02/2019   SpO2 98%   BMI 27.46 kg/m   Physical Exam Vitals and nursing note reviewed.    Constitutional:      General: She is not in acute distress.    Appearance: Normal appearance.  HENT:     Head: Normocephalic and atraumatic.     Nose: Nose normal.  Eyes:     Conjunctiva/sclera: Conjunctivae normal.     Pupils: Pupils are equal, round, and reactive to light.  Cardiovascular:     Rate and Rhythm: Normal rate and regular rhythm.     Pulses: Normal pulses.     Heart sounds: Normal heart sounds.  Pulmonary:     Effort: Pulmonary effort is normal.     Breath sounds: Normal breath sounds.  Abdominal:     General: Abdomen is flat. Bowel sounds are normal.     Tenderness: There is no abdominal tenderness. There is no guarding or rebound.  Musculoskeletal:        General: Normal range of motion.     Cervical back: Normal range of motion and neck supple.  Skin:    General: Skin is warm and dry.     Capillary Refill: Capillary refill takes less than 2 seconds.  Neurological:     General: No focal deficit present.     Mental Status: She is alert and oriented to person, place, and time.     Deep Tendon Reflexes: Reflexes normal.  Psychiatric:        Mood and Affect: Mood normal.        Behavior: Behavior normal.     ED Results / Procedures / Treatments   Labs (all labs ordered are listed, but only abnormal results are displayed) Results for orders placed or performed during the hospital encounter of 11/02/19  Basic metabolic panel  Result Value Ref Range   Sodium 139 135 - 145 mmol/L   Potassium 3.5 3.5 - 5.1 mmol/L   Chloride 106 98 - 111 mmol/L   CO2 24 22 - 32 mmol/L   Glucose, Bld 88 70 - 99 mg/dL   BUN 17 6 - 20 mg/dL   Creatinine, Ser 1.610.61 0.44 - 1.00 mg/dL   Calcium 9.4 8.9 - 09.610.3 mg/dL   GFR calc non Af Amer >60 >60 mL/min   GFR calc Af Amer >60 >60 mL/min   Anion gap 9 5 - 15  CBC  Result Value Ref Range   WBC 10.3 4.0 - 10.5 K/uL   RBC 4.55 3.87 - 5.11 MIL/uL   Hemoglobin 13.3 12.0 - 15.0 g/dL   HCT 04.540.7 36 - 46 %   MCV 89.5 80.0 - 100.0 fL    MCH 29.2 26.0 - 34.0 pg   MCHC 32.7 30.0 - 36.0 g/dL   RDW 40.911.5 81.111.5 - 91.415.5 %   Platelets 230 150 - 400 K/uL   nRBC 0.0 0.0 - 0.2 %  D-dimer, quantitative (not [facility])  Result Value Ref Range   D-Dimer, Quant 0.37  0.00 - 0.50 ug/mL-FEU  I-Stat beta hCG blood, ED  Result Value Ref Range   I-stat hCG, quantitative <5.0 <5 mIU/mL   Comment 3          Troponin I (High Sensitivity)  Result Value Ref Range   Troponin I (High Sensitivity) <2 <18 ng/L   DG Chest 2 View  Result Date: 11/02/2019 CLINICAL DATA:  Chest pain. Increasing shortness of breath and chest pressure over the past 3 days. EXAM: CHEST - 2 VIEW COMPARISON:  Chest CTA 01/31/2019 FINDINGS: The cardiomediastinal contours are normal. Subsegmental opacities in both lung bases. Pulmonary vasculature is normal. No consolidation, pleural effusion, or pneumothorax. No acute osseous abnormalities are seen. Again seen pectus excavatum. IMPRESSION: Subsegmental bibasilar opacities, favor atelectasis. Electronically Signed   By: Narda Rutherford M.D.   On: 11/02/2019 22:33   CT Angio Chest PE W and/or Wo Contrast  Result Date: 11/03/2019 CLINICAL DATA:  Chest pain and shortness of breath EXAM: CT ANGIOGRAPHY CHEST WITH CONTRAST TECHNIQUE: Multidetector CT imaging of the chest was performed using the standard protocol during bolus administration of intravenous contrast. Multiplanar CT image reconstructions and MIPs were obtained to evaluate the vascular anatomy. CONTRAST:  OMNIPAQUE IOHEXOL 350 MG/ML SOLN COMPARISON:  Chest x-ray from earlier in the same day FINDINGS: Cardiovascular: Thoracic aorta is incompletely opacified but within normal limits without evidence aneurysmal dilatation or definitive dissection. No cardiac enlargement is seen. The pulmonary artery shows a normal branching pattern bilaterally. No filling defects are noted to suggest pulmonary embolism. Mediastinum/Nodes: Thoracic inlet is within normal limits. No hilar or  mediastinal adenopathy is noted. The esophagus as visualized is within normal limits. Lungs/Pleura: The lungs are well aerated bilaterally. No focal infiltrate or sizable effusion is seen. No pneumothorax is noted. No sizable parenchymal nodules are noted. Upper Abdomen: Visualized upper abdomen is unremarkable. Musculoskeletal: No chest wall abnormality. No acute or significant osseous findings. Review of the MIP images confirms the above findings. IMPRESSION: No evidence of pulmonary emboli. No acute abnormality noted. Electronically Signed   By: Alcide Clever M.D.   On: 11/03/2019 00:21    EKG In muse  Radiology DG Chest 2 View  Result Date: 11/02/2019 CLINICAL DATA:  Chest pain. Increasing shortness of breath and chest pressure over the past 3 days. EXAM: CHEST - 2 VIEW COMPARISON:  Chest CTA 01/31/2019 FINDINGS: The cardiomediastinal contours are normal. Subsegmental opacities in both lung bases. Pulmonary vasculature is normal. No consolidation, pleural effusion, or pneumothorax. No acute osseous abnormalities are seen. Again seen pectus excavatum. IMPRESSION: Subsegmental bibasilar opacities, favor atelectasis. Electronically Signed   By: Narda Rutherford M.D.   On: 11/02/2019 22:33   CT Angio Chest PE W and/or Wo Contrast  Result Date: 11/03/2019 CLINICAL DATA:  Chest pain and shortness of breath EXAM: CT ANGIOGRAPHY CHEST WITH CONTRAST TECHNIQUE: Multidetector CT imaging of the chest was performed using the standard protocol during bolus administration of intravenous contrast. Multiplanar CT image reconstructions and MIPs were obtained to evaluate the vascular anatomy. CONTRAST:  OMNIPAQUE IOHEXOL 350 MG/ML SOLN COMPARISON:  Chest x-ray from earlier in the same day FINDINGS: Cardiovascular: Thoracic aorta is incompletely opacified but within normal limits without evidence aneurysmal dilatation or definitive dissection. No cardiac enlargement is seen. The pulmonary artery shows a normal  branching pattern bilaterally. No filling defects are noted to suggest pulmonary embolism. Mediastinum/Nodes: Thoracic inlet is within normal limits. No hilar or mediastinal adenopathy is noted. The esophagus as visualized is within normal limits.  Lungs/Pleura: The lungs are well aerated bilaterally. No focal infiltrate or sizable effusion is seen. No pneumothorax is noted. No sizable parenchymal nodules are noted. Upper Abdomen: Visualized upper abdomen is unremarkable. Musculoskeletal: No chest wall abnormality. No acute or significant osseous findings. Review of the MIP images confirms the above findings. IMPRESSION: No evidence of pulmonary emboli. No acute abnormality noted. Electronically Signed   By: Alcide Clever M.D.   On: 11/03/2019 00:21    Procedures Procedures (including critical care time)  Medications Ordered in ED Medications  sodium chloride flush (NS) 0.9 % injection 3 mL (has no administration in time range)  alum & mag hydroxide-simeth (MAALOX/MYLANTA) 200-200-20 MG/5ML suspension 30 mL (has no administration in time range)  naproxen (NAPROSYN) tablet 375 mg (has no administration in time range)  iohexol (OMNIPAQUE) 350 MG/ML injection 100 mL (100 mLs Intravenous Contrast Given 11/02/19 2358)    ED Course  I have reviewed the triage vital signs and the nursing notes.  Pertinent labs & imaging results that were available during my care of the patient were reviewed by me and considered in my medical decision making (see chart for details).    Ruled out for MI and PE in the ED.  Will start NSAIDS.  HEART score is 1 very low risk for MACE.  Strict return precautions given.  Debbie Christensen was evaluated in Emergency Department on 11/03/2019 for the symptoms described in the history of present illness. She was evaluated in the context of the global COVID-19 pandemic, which necessitated consideration that the patient might be at risk for infection with the SARS-CoV-2 virus that causes  COVID-19. Institutional protocols and algorithms that pertain to the evaluation of patients at risk for COVID-19 are in a state of rapid change based on information released by regulatory bodies including the CDC and federal and state organizations. These policies and algorithms were followed during the patient's care in the ED.  Final Clinical Impression(s) / ED Diagnoses  Return for intractable cough, coughing up blood,fevers >100.4 unrelieved by medication, shortness of breath, intractable vomiting, chest pain, shortness of breath, weakness,numbness, changes in speech, facial asymmetry,abdominal pain, passing out,Inability to tolerate liquids or food, cough, altered mental status or any concerns. No signs of systemic illness or infection. The patient is nontoxic-appearing on exam and vital signs are within normal limits.   I have reviewed the triage vital signs and the nursing notes. Pertinent labs &imaging results that were available during my care of the patient were reviewed by me and considered in my medical decision making (see chart for details).After history, exam, and medical workup I feel the patient has beenappropriately medically screened and is safe for discharge home. Pertinent diagnoses were discussed with the patient. Patient was given return precautions.      Rulon Abdalla, MD 11/03/19 4580

## 2019-11-07 MED FILL — LO LOESTRIN FE 1-10 TABLET: 1 MG-10 MCG | 84 days supply | Qty: 84 | Fill #4

## 2019-11-08 MED FILL — traMADol HCL 50 MG TABS: 50 | 10 days supply | Qty: 10 | Fill #0

## 2019-11-09 ENCOUNTER — Other Ambulatory Visit (HOSPITAL_COMMUNITY): Payer: Self-pay | Admitting: Family Medicine

## 2019-11-09 DIAGNOSIS — R0600 Dyspnea, unspecified: Secondary | ICD-10-CM

## 2019-11-25 ENCOUNTER — Ambulatory Visit (HOSPITAL_COMMUNITY): Payer: No Typology Code available for payment source | Attending: Cardiology

## 2019-11-25 ENCOUNTER — Other Ambulatory Visit (HOSPITAL_COMMUNITY): Payer: No Typology Code available for payment source

## 2019-11-25 ENCOUNTER — Other Ambulatory Visit: Payer: Self-pay

## 2019-11-25 DIAGNOSIS — R0600 Dyspnea, unspecified: Secondary | ICD-10-CM | POA: Insufficient documentation

## 2019-11-25 LAB — ECHOCARDIOGRAM COMPLETE
Area-P 1/2: 4.78 cm2
S' Lateral: 2.9 cm

## 2020-02-21 ENCOUNTER — Ambulatory Visit: Payer: Self-pay | Attending: Internal Medicine

## 2020-02-21 ENCOUNTER — Ambulatory Visit: Payer: No Typology Code available for payment source

## 2020-02-21 DIAGNOSIS — Z23 Encounter for immunization: Secondary | ICD-10-CM

## 2020-02-21 NOTE — Progress Notes (Signed)
   Covid-19 Vaccination Clinic  Name:  Regina Ganci    MRN: [id number] DOB: 1996/01/30  02/21/2020  Ms. Solorzano was observed post Covid-19 immunization for 15 minutes without incident. She was provided with Vaccine Information Sheet and instruction to access the V-Safe system.   Ms. Peron was instructed to call 911 with any severe reactions post vaccine: Marland Kitchen Difficulty breathing  . Swelling of face and throat  . A fast heartbeat  . A bad rash all over body  . Dizziness and weakness   Immunizations Administered    Name Date Dose VIS Date Route   Pfizer COVID-19 Vaccine 02/21/2020  3:40 PM 0.3 mL 01/25/2020 Intramuscular   Manufacturer: [employer], [employer]   Lot: [id number]   NDC: [address]-7

## 2020-12-17 LAB — OB RESULTS CONSOLE GC/CHLAMYDIA
Chlamydia: NEGATIVE
Gonorrhea: NEGATIVE

## 2021-01-07 LAB — OB RESULTS CONSOLE ABO/RH: RH Type: POSITIVE

## 2021-01-07 LAB — OB RESULTS CONSOLE ANTIBODY SCREEN: Antibody Screen: NEGATIVE

## 2021-01-07 LAB — OB RESULTS CONSOLE RPR: RPR: NONREACTIVE

## 2021-01-07 LAB — OB RESULTS CONSOLE RUBELLA ANTIBODY, IGM: Rubella: IMMUNE

## 2021-01-07 LAB — OB RESULTS CONSOLE HEPATITIS B SURFACE ANTIGEN: Hepatitis B Surface Ag: NEGATIVE

## 2021-01-07 LAB — HEPATITIS C ANTIBODY: HCV Ab: NEGATIVE

## 2021-01-07 LAB — OB RESULTS CONSOLE HIV ANTIBODY (ROUTINE TESTING): HIV: NONREACTIVE

## 2021-04-07 NOTE — L&D Delivery Note (Signed)
Delivery Note ?Patient pushed for At 3:53 PM a viable female was delivered via  (Presentation: Left Occiput Anterior).  APGAR: 9, 9; weight 3830 gm (8lb 7.1oz) .   ?Placenta status: Spontaneous, Intact.  Cord: 3 vessels with the following complications: None.  Cord pH: n/a ? ?Anesthesia: Epidural ?Episiotomy:  None ?Lacerations:  Left sulcal to 2nd degree perineal.  The tissue quality was poor and multiple stitched pulled through the tissue.  Following repair, DRE showed normal rectal tone and in tact sphincter ?Suture Repair: 2.0 vicryl rapide ?Est. Blood Loss (mL):  450 mL ? ?Mom to postpartum.  Baby to Couplet care / Skin to Skin. ? ?Debbie Christensen ?07/26/2021, 4:50 PM ? ? ? ?

## 2021-05-28 ENCOUNTER — Inpatient Hospital Stay (HOSPITAL_COMMUNITY)
Admission: AD | Admit: 2021-05-28 | Discharge: 2021-05-29 | Disposition: A | Discharge status: Home / Self Care | Payer: BLUE CROSS/BLUE SHIELD | Attending: Obstetrics and Gynecology | Admitting: Obstetrics and Gynecology

## 2021-05-28 ENCOUNTER — Other Ambulatory Visit: Payer: Self-pay

## 2021-05-28 ENCOUNTER — Encounter (HOSPITAL_COMMUNITY): Payer: Self-pay | Admitting: Obstetrics and Gynecology

## 2021-05-28 DIAGNOSIS — Z20822 Contact with and (suspected) exposure to covid-19: Secondary | ICD-10-CM | POA: Insufficient documentation

## 2021-05-28 DIAGNOSIS — O99513 Diseases of the respiratory system complicating pregnancy, third trimester: Secondary | ICD-10-CM | POA: Insufficient documentation

## 2021-05-28 DIAGNOSIS — R52 Pain, unspecified: Secondary | ICD-10-CM | POA: Insufficient documentation

## 2021-05-28 DIAGNOSIS — Z3A30 30 weeks gestation of pregnancy: Secondary | ICD-10-CM

## 2021-05-28 DIAGNOSIS — R112 Nausea with vomiting, unspecified: Secondary | ICD-10-CM

## 2021-05-28 DIAGNOSIS — O99283 Endocrine, nutritional and metabolic diseases complicating pregnancy, third trimester: Secondary | ICD-10-CM | POA: Insufficient documentation

## 2021-05-28 DIAGNOSIS — E860 Dehydration: Secondary | ICD-10-CM | POA: Insufficient documentation

## 2021-05-28 DIAGNOSIS — O212 Late vomiting of pregnancy: Secondary | ICD-10-CM | POA: Insufficient documentation

## 2021-05-28 DIAGNOSIS — J029 Acute pharyngitis, unspecified: Secondary | ICD-10-CM | POA: Insufficient documentation

## 2021-05-28 LAB — URINALYSIS, ROUTINE W REFLEX MICROSCOPIC
Bilirubin Urine: NEGATIVE
Glucose, UA: NEGATIVE mg/dL
Hgb urine dipstick: NEGATIVE
Ketones, ur: 80 mg/dL — AB
Leukocytes,Ua: NEGATIVE
Nitrite: NEGATIVE
Protein, ur: 30 mg/dL — AB
Specific Gravity, Urine: 1.026 (ref 1.005–1.030)
pH: 5 (ref 5.0–8.0)

## 2021-05-28 LAB — RESP PANEL BY RT-PCR (FLU A&B, COVID) ARPGX2
Influenza A by PCR: NEGATIVE
Influenza B by PCR: NEGATIVE
SARS Coronavirus 2 by RT PCR: NEGATIVE

## 2021-05-28 MED ORDER — FAMOTIDINE IN NACL 20-0.9 MG/50ML-% IV SOLN
20.0000 mg | Freq: Once | INTRAVENOUS | Status: AC
  Administered 2021-05-28: 20 mg via INTRAVENOUS
  Filled 2021-05-28: qty 50

## 2021-05-28 MED ORDER — SODIUM CHLORIDE 0.9 % IV SOLN: Freq: Once | INTRAVENOUS | Status: DC

## 2021-05-28 MED ORDER — SODIUM CHLORIDE 0.9 % IV SOLN: Freq: Once | INTRAVENOUS | Status: AC

## 2021-05-28 MED ORDER — PROCHLORPERAZINE EDISYLATE 10 MG/2ML IJ SOLN
10.0000 mg | Freq: Once | INTRAMUSCULAR | Status: AC
  Administered 2021-05-28: 10 mg via INTRAVENOUS
  Filled 2021-05-28: qty 2

## 2021-05-28 MED ORDER — LACTATED RINGERS IV BOLUS
1000.0000 mL | Freq: Once | INTRAVENOUS | Status: AC
  Administered 2021-05-28: 1000 mL via INTRAVENOUS

## 2021-05-28 NOTE — MAU Provider Note (Incomplete Revision)
History     CSN: [id number]  Arrival date and time: 05/28/21 1858   Event Date/Time   First Provider Initiated Contact with Patient 05/28/21 2118      Chief Complaint  Patient presents with   Emesis During Pregnancy   HPI Debbie Christensen is a 26 y.o. G1P0 at [redacted]w[redacted]d who presents with nausea and vomiting for 24 hours. She states she has struggled with nausea throughout the pregnancy but zofran normally helps. She has been unable to keep her zofran down. She also reports a sore throat and body aches. She went to Urgent Care and had rapid covid and flu tests that were negative. She denies any pain or bleeding. She reports normal fetal movement.   OB History     Gravida  1   Para      Term      Preterm      AB      Living         SAB      IAB      Ectopic      Multiple      Live Births              Past Medical History:  Diagnosis Date   EDS (Ehlers-Danlos syndrome)    Ehlers-Danlos disease    Kidney calculi    Migraines    Pineal gland cyst    Raynaud's disease    Renal disorder     Past Surgical History:  Procedure Laterality Date   FOOT SURGERY Right    KNEE ARTHROSCOPY     6 bilateral knee surgeries   KNEE ARTHROSCOPY     TONSILLECTOMY     WISDOM TOOTH EXTRACTION      Family History  Problem Relation Age of Onset   Healthy Mother    Thyroid disease Father     Social History   Tobacco Use   Smoking status: Never   Smokeless tobacco: Never  Vaping Use   Vaping Use: Never used  Substance Use Topics   Alcohol use: Never   Drug use: Never    Allergies:  Allergies  Allergen Reactions   Cinnamon Other (See Comments)   Ketorolac Other (See Comments)    Had trouble seeing and talking post injection Had trouble seeing and talking post injection   Tape Hives    All adhesives    Medications Prior to Admission  Medication Sig Dispense Refill Last Dose   lidocaine (LIDODERM) 5 % Place 1 patch onto the skin daily. Remove & Discard  patch within 12 hours or as directed by MD 30 patch 0    LO LOESTRIN FE 1 MG-10 MCG / 10 MCG tablet Take 1 tablet by mouth daily.        Review of Systems  Constitutional: Negative.  Negative for fatigue and fever.  HENT:  Positive for sore throat.   Respiratory: Negative.  Negative for shortness of breath.   Cardiovascular: Negative.  Negative for chest pain.  Gastrointestinal:  Positive for nausea and vomiting. Negative for abdominal pain, constipation and diarrhea.  Genitourinary: Negative.  Negative for dysuria, vaginal bleeding and vaginal discharge.  Neurological: Negative.  Negative for dizziness and headaches.  Physical Exam   Blood pressure 126/69, pulse (!) 137, temperature (!) 97.4 F (36.3 C), resp. rate 18, height 5\' 4"  (1.626 m), weight 94.8 kg, last menstrual period 10/25/2020, SpO2 97 %.  Physical Exam Vitals and nursing note reviewed.  Constitutional:  General: She is not in acute distress.    Appearance: She is well-developed.  HENT:     Head: Normocephalic.  Eyes:     Pupils: Pupils are equal, round, and reactive to light.  Cardiovascular:     Rate and Rhythm: Normal rate and regular rhythm.     Heart sounds: Normal heart sounds.  Pulmonary:     Effort: Pulmonary effort is normal. No respiratory distress.     Breath sounds: Normal breath sounds.  Abdominal:     General: Bowel sounds are normal. There is no distension.     Palpations: Abdomen is soft.     Tenderness: There is no abdominal tenderness.  Skin:    General: Skin is warm and dry.  Neurological:     Mental Status: She is alert and oriented to person, place, and time.  Psychiatric:        Mood and Affect: Mood normal.        Behavior: Behavior normal.        Thought Content: Thought content normal.        Judgment: Judgment normal.   Fetal Tracing:  Baseline: 170 Variability: min/moderate Accels: none Decels: none  Toco: occasional uc's with UI   MAU Course  Procedures No  results found for this or any previous visit (from the past 24 hour(s)).   MDM UA Resp Panel LR bolus Pepcid Compazine  Care turned over to [facility]. Coila Wardell CNM at 2200.  Rolm Bookbinder, CNM 05/28/21   Assessment and Plan

## 2021-05-28 NOTE — MAU Note (Signed)
N/V for 24hrs and unable to keep down anything. Has had n/v entire pregnancy but usually nausea meds work. Was seen at Urgent Care this am and was negative for Covid and Flu. Denies VB. Good FM. No diarrhea. Tried Zofran this am but did not help

## 2021-05-28 NOTE — MAU Provider Note (Addendum)
History     CSN: [id number]  Arrival date and time: 05/28/21 1858   Event Date/Time   First Provider Initiated Contact with Patient 05/28/21 2118      Chief Complaint  Patient presents with   Emesis During Pregnancy   HPI Debbie Christensen is a 26 y.o. G1P0 at [redacted]w[redacted]d who presents with nausea and vomiting for 24 hours. She states she has struggled with nausea throughout the pregnancy but zofran normally helps. She has been unable to keep her zofran down. She also reports a sore throat and body aches. She went to Urgent Care and had rapid covid and flu tests that were negative. She denies any pain or bleeding. She reports normal fetal movement.   OB History     Gravida  1   Para      Term      Preterm      AB      Living         SAB      IAB      Ectopic      Multiple      Live Births              Past Medical History:  Diagnosis Date   EDS (Ehlers-Danlos syndrome)    Ehlers-Danlos disease    Kidney calculi    Migraines    Pineal gland cyst    Raynaud's disease    Renal disorder     Past Surgical History:  Procedure Laterality Date   FOOT SURGERY Right    KNEE ARTHROSCOPY     6 bilateral knee surgeries   KNEE ARTHROSCOPY     TONSILLECTOMY     WISDOM TOOTH EXTRACTION      Family History  Problem Relation Age of Onset   Healthy Mother    Thyroid disease Father     Social History   Tobacco Use   Smoking status: Never   Smokeless tobacco: Never  Vaping Use   Vaping Use: Never used  Substance Use Topics   Alcohol use: Never   Drug use: Never    Allergies:  Allergies  Allergen Reactions   Cinnamon Other (See Comments)   Ketorolac Other (See Comments)    Had trouble seeing and talking post injection Had trouble seeing and talking post injection   Tape Hives    All adhesives    Medications Prior to Admission  Medication Sig Dispense Refill Last Dose   lidocaine (LIDODERM) 5 % Place 1 patch onto the skin daily. Remove & Discard  patch within 12 hours or as directed by MD 30 patch 0    LO LOESTRIN FE 1 MG-10 MCG / 10 MCG tablet Take 1 tablet by mouth daily.        Review of Systems  Constitutional: Negative.  Negative for fatigue and fever.  HENT:  Positive for sore throat.   Respiratory: Negative.  Negative for shortness of breath.   Cardiovascular: Negative.  Negative for chest pain.  Gastrointestinal:  Positive for nausea and vomiting. Negative for abdominal pain, constipation and diarrhea.  Genitourinary: Negative.  Negative for dysuria, vaginal bleeding and vaginal discharge.  Neurological: Negative.  Negative for dizziness and headaches.  Physical Exam   Blood pressure 126/69, pulse (!) 137, temperature (!) 97.4 F (36.3 C), resp. rate 18, height 5\' 4"  (1.626 m), weight 94.8 kg, last menstrual period 10/25/2020, SpO2 97 %.  Physical Exam Vitals and nursing note reviewed.  Constitutional:  General: She is not in acute distress.    Appearance: She is well-developed.  HENT:     Head: Normocephalic.  Eyes:     Pupils: Pupils are equal, round, and reactive to light.  Cardiovascular:     Rate and Rhythm: Normal rate and regular rhythm.     Heart sounds: Normal heart sounds.  Pulmonary:     Effort: Pulmonary effort is normal. No respiratory distress.     Breath sounds: Normal breath sounds.  Abdominal:     General: Bowel sounds are normal. There is no distension.     Palpations: Abdomen is soft.     Tenderness: There is no abdominal tenderness.  Skin:    General: Skin is warm and dry.  Neurological:     Mental Status: She is alert and oriented to person, place, and time.  Psychiatric:        Mood and Affect: Mood normal.        Behavior: Behavior normal.        Thought Content: Thought content normal.        Judgment: Judgment normal.   Fetal Tracing:  Baseline: 170 Variability: min/moderate Accels: none Decels: none  Toco: occasional uc's with UI   MAU Course  Procedures No  results found for this or any previous visit (from the past 24 hour(s)).   MDM UA Resp Panel LR bolus Pepcid Compazine  Care turned over to [facility]. Annasophia Crocker CNM at 2200.  Rolm Bookbinder, CNM 05/28/21  Results for orders placed or performed during the hospital encounter of 05/28/21 (from the past 24 hour(s))  Urinalysis, Routine w reflex microscopic Urine, Clean Catch     Status: Abnormal   Collection Time: 05/28/21  9:12 PM  Result Value Ref Range   Color, Urine AMBER (A) YELLOW   APPearance HAZY (A) CLEAR   Specific Gravity, Urine 1.026 1.005 - 1.030   pH 5.0 5.0 - 8.0   Glucose, UA NEGATIVE NEGATIVE mg/dL   Hgb urine dipstick NEGATIVE NEGATIVE   Bilirubin Urine NEGATIVE NEGATIVE   Ketones, ur 80 (A) NEGATIVE mg/dL   Protein, ur 30 (A) NEGATIVE mg/dL   Nitrite NEGATIVE NEGATIVE   Leukocytes,Ua NEGATIVE NEGATIVE   RBC / HPF 0-5 0 - 5 RBC/hpf   WBC, UA 0-5 0 - 5 WBC/hpf   Bacteria, UA RARE (A) NONE SEEN   Squamous Epithelial / LPF 0-5 0 - 5   Mucus PRESENT   Resp Panel by RT-PCR (Flu A&B, Covid) Nasopharyngeal Swab     Status: None   Collection Time: 05/28/21  9:35 PM   Specimen: Nasopharyngeal Swab; Nasopharyngeal(NP) swabs in vial transport medium  Result Value Ref Range   SARS Coronavirus 2 by RT PCR NEGATIVE NEGATIVE   Influenza A by PCR NEGATIVE NEGATIVE   Influenza B by PCR NEGATIVE NEGATIVE   HR remained in 130s after two liters of fluid, so third liter was given HR came down into 120s Was able to tolerate PO intake well after fluids and meds  Assessment and Plan  Single IUP at.[redacted]w[redacted]d Nausea and vomiting Dehydration  Discharge home Has Rx for zofran at home Advance diet as tolerated Followup in office Encouraged to return if she develops worsening of symptoms, increase in pain, fever, or other concerning symptoms.   Aviva Signs, CNM

## 2021-05-29 DIAGNOSIS — E860 Dehydration: Secondary | ICD-10-CM | POA: Diagnosis not present

## 2021-05-29 DIAGNOSIS — R112 Nausea with vomiting, unspecified: Secondary | ICD-10-CM

## 2021-05-29 DIAGNOSIS — Z3A30 30 weeks gestation of pregnancy: Secondary | ICD-10-CM

## 2021-05-29 MED ORDER — SODIUM CHLORIDE 0.9 % IV SOLN: Freq: Once | INTRAVENOUS | Status: AC

## 2021-05-29 NOTE — Progress Notes (Signed)
WRitten and verbal d/c instructions given and understanding voiced.  

## 2021-07-09 LAB — OB RESULTS CONSOLE GBS: GBS: POSITIVE

## 2021-07-24 ENCOUNTER — Telehealth (HOSPITAL_COMMUNITY): Payer: Self-pay | Admitting: *Deleted

## 2021-07-24 ENCOUNTER — Encounter (HOSPITAL_COMMUNITY): Payer: Self-pay | Admitting: *Deleted

## 2021-07-24 NOTE — Telephone Encounter (Signed)
Preadmission screen  

## 2021-07-25 ENCOUNTER — Other Ambulatory Visit: Payer: Self-pay

## 2021-07-26 ENCOUNTER — Encounter (HOSPITAL_COMMUNITY): Payer: Self-pay | Admitting: Obstetrics and Gynecology

## 2021-07-26 ENCOUNTER — Inpatient Hospital Stay (HOSPITAL_COMMUNITY)
Admission: AD | Admit: 2021-07-26 | Discharge: 2021-07-28 | DRG: 806 | Disposition: A | Discharge status: Home / Self Care | Payer: BLUE CROSS/BLUE SHIELD | Attending: Obstetrics | Admitting: Obstetrics

## 2021-07-26 ENCOUNTER — Inpatient Hospital Stay (HOSPITAL_COMMUNITY): Payer: BLUE CROSS/BLUE SHIELD | Admitting: Anesthesiology

## 2021-07-26 ENCOUNTER — Inpatient Hospital Stay (HOSPITAL_COMMUNITY): Payer: BLUE CROSS/BLUE SHIELD

## 2021-07-26 DIAGNOSIS — O99824 Streptococcus B carrier state complicating childbirth: Secondary | ICD-10-CM | POA: Diagnosis present

## 2021-07-26 DIAGNOSIS — O99892 Other specified diseases and conditions complicating childbirth: Secondary | ICD-10-CM | POA: Diagnosis present

## 2021-07-26 DIAGNOSIS — Z3A39 39 weeks gestation of pregnancy: Secondary | ICD-10-CM

## 2021-07-26 DIAGNOSIS — O26893 Other specified pregnancy related conditions, third trimester: Secondary | ICD-10-CM | POA: Diagnosis present

## 2021-07-26 DIAGNOSIS — Q796 Ehlers-Danlos syndrome, unspecified: Secondary | ICD-10-CM

## 2021-07-26 DIAGNOSIS — O9081 Anemia of the puerperium: Secondary | ICD-10-CM | POA: Diagnosis not present

## 2021-07-26 DIAGNOSIS — Z3490 Encounter for supervision of normal pregnancy, unspecified, unspecified trimester: Secondary | ICD-10-CM

## 2021-07-26 DIAGNOSIS — D62 Acute posthemorrhagic anemia: Secondary | ICD-10-CM | POA: Diagnosis not present

## 2021-07-26 LAB — CBC
HCT: 32.6 % — ABNORMAL LOW (ref 36.0–46.0)
Hemoglobin: 10.4 g/dL — ABNORMAL LOW (ref 12.0–15.0)
MCH: 27.9 pg (ref 26.0–34.0)
MCHC: 31.9 g/dL (ref 30.0–36.0)
MCV: 87.4 fL (ref 80.0–100.0)
Platelets: 192 10*3/uL (ref 150–400)
RBC: 3.73 MIL/uL — ABNORMAL LOW (ref 3.87–5.11)
RDW: 14.5 % (ref 11.5–15.5)
WBC: 12.7 10*3/uL — ABNORMAL HIGH (ref 4.0–10.5)
nRBC: 0.2 % (ref 0.0–0.2)

## 2021-07-26 LAB — RPR: RPR Ser Ql: NONREACTIVE

## 2021-07-26 MED ORDER — PRENATAL MULTIVITAMIN CH
1.0000 | ORAL_TABLET | Freq: Every day | ORAL | Status: DC
  Administered 2021-07-27 – 2021-07-28 (×2): 1 via ORAL
  Filled 2021-07-26 (×2): qty 1

## 2021-07-26 MED ORDER — LACTATED RINGERS IV SOLN
500.0000 mL | INTRAVENOUS | Status: DC | PRN
  Administered 2021-07-26: 500 mL via INTRAVENOUS

## 2021-07-26 MED ORDER — ONDANSETRON HCL 4 MG/2ML IJ SOLN
4.0000 mg | Freq: Four times a day (QID) | INTRAMUSCULAR | Status: DC | PRN
  Administered 2021-07-26 (×2): 4 mg via INTRAVENOUS
  Filled 2021-07-26 (×2): qty 2

## 2021-07-26 MED ORDER — PHENYLEPHRINE 80 MCG/ML (10ML) SYRINGE FOR IV PUSH (FOR BLOOD PRESSURE SUPPORT): 80.0000 ug | PREFILLED_SYRINGE | INTRAVENOUS | Status: DC | PRN

## 2021-07-26 MED ORDER — OXYCODONE HCL 5 MG PO TABS: 5.0000 mg | ORAL_TABLET | ORAL | Status: DC | PRN

## 2021-07-26 MED ORDER — SODIUM CHLORIDE 0.9 % IV SOLN
5.0000 10*6.[IU] | Freq: Once | INTRAVENOUS | Status: AC
  Administered 2021-07-26: 5 10*6.[IU] via INTRAVENOUS
  Filled 2021-07-26: qty 5

## 2021-07-26 MED ORDER — DIPHENHYDRAMINE HCL 50 MG/ML IJ SOLN: 12.5000 mg | INTRAMUSCULAR | Status: DC | PRN

## 2021-07-26 MED ORDER — EPHEDRINE 5 MG/ML INJ: 10.0000 mg | INTRAVENOUS | Status: DC | PRN

## 2021-07-26 MED ORDER — TETANUS-DIPHTH-ACELL PERTUSSIS 5-2.5-18.5 LF-MCG/0.5 IM SUSY: 0.5000 mL | PREFILLED_SYRINGE | Freq: Once | INTRAMUSCULAR | Status: DC

## 2021-07-26 MED ORDER — ACETAMINOPHEN 325 MG PO TABS: 650.0000 mg | ORAL_TABLET | ORAL | Status: DC | PRN

## 2021-07-26 MED ORDER — LIDOCAINE HCL (PF) 1 % IJ SOLN
INTRAMUSCULAR | Status: DC | PRN
Start: 2021-07-26 — End: 2021-07-26
  Administered 2021-07-26 (×2): 4 mL via EPIDURAL

## 2021-07-26 MED ORDER — FENTANYL CITRATE (PF) 100 MCG/2ML IJ SOLN
100.0000 ug | INTRAMUSCULAR | Status: DC | PRN
  Administered 2021-07-26: 100 ug via INTRAVENOUS
  Filled 2021-07-26: qty 2

## 2021-07-26 MED ORDER — WITCH HAZEL-GLYCERIN EX PADS: 1.0000 "application " | MEDICATED_PAD | CUTANEOUS | Status: DC | PRN

## 2021-07-26 MED ORDER — OXYCODONE-ACETAMINOPHEN 5-325 MG PO TABS: 1.0000 | ORAL_TABLET | ORAL | Status: DC | PRN

## 2021-07-26 MED ORDER — COCONUT OIL OIL
1.0000 "application " | TOPICAL_OIL | Status: DC | PRN
  Administered 2021-07-28: 1 via TOPICAL

## 2021-07-26 MED ORDER — DIBUCAINE (PERIANAL) 1 % EX OINT: 1.0000 "application " | TOPICAL_OINTMENT | CUTANEOUS | Status: DC | PRN

## 2021-07-26 MED ORDER — SIMETHICONE 80 MG PO CHEW: 80.0000 mg | CHEWABLE_TABLET | ORAL | Status: DC | PRN

## 2021-07-26 MED ORDER — IBUPROFEN 600 MG PO TABS
600.0000 mg | ORAL_TABLET | Freq: Four times a day (QID) | ORAL | Status: DC
  Administered 2021-07-26 – 2021-07-28 (×8): 600 mg via ORAL
  Filled 2021-07-26 (×8): qty 1

## 2021-07-26 MED ORDER — DIPHENHYDRAMINE HCL 25 MG PO CAPS: 25.0000 mg | ORAL_CAPSULE | Freq: Four times a day (QID) | ORAL | Status: DC | PRN

## 2021-07-26 MED ORDER — SOD CITRATE-CITRIC ACID 500-334 MG/5ML PO SOLN: 30.0000 mL | ORAL | Status: DC | PRN

## 2021-07-26 MED ORDER — OXYCODONE HCL 5 MG PO TABS: 10.0000 mg | ORAL_TABLET | ORAL | Status: DC | PRN

## 2021-07-26 MED ORDER — FENTANYL-BUPIVACAINE-NACL 0.5-0.125-0.9 MG/250ML-% EP SOLN
12.0000 mL/h | EPIDURAL | Status: DC | PRN
  Administered 2021-07-26: 12 mL/h via EPIDURAL
  Filled 2021-07-26: qty 250

## 2021-07-26 MED ORDER — BENZOCAINE-MENTHOL 20-0.5 % EX AERO
1.0000 "application " | INHALATION_SPRAY | CUTANEOUS | Status: DC | PRN
  Administered 2021-07-26: 1 via TOPICAL
  Filled 2021-07-26: qty 56

## 2021-07-26 MED ORDER — TERBUTALINE SULFATE 1 MG/ML IJ SOLN: 0.2500 mg | Freq: Once | INTRAMUSCULAR | Status: DC | PRN

## 2021-07-26 MED ORDER — PENICILLIN G POT IN DEXTROSE 60000 UNIT/ML IV SOLN
3.0000 10*6.[IU] | INTRAVENOUS | Status: DC
  Administered 2021-07-26 (×2): 3 10*6.[IU] via INTRAVENOUS
  Filled 2021-07-26 (×3): qty 50

## 2021-07-26 MED ORDER — LACTATED RINGERS IV SOLN: INTRAVENOUS | Status: DC

## 2021-07-26 MED ORDER — OXYTOCIN BOLUS FROM INFUSION
333.0000 mL | Freq: Once | INTRAVENOUS | Status: AC
  Administered 2021-07-26: 333 mL via INTRAVENOUS

## 2021-07-26 MED ORDER — SENNOSIDES-DOCUSATE SODIUM 8.6-50 MG PO TABS
2.0000 | ORAL_TABLET | ORAL | Status: DC
  Administered 2021-07-27 – 2021-07-28 (×2): 2 via ORAL
  Filled 2021-07-26 (×2): qty 2

## 2021-07-26 MED ORDER — OXYTOCIN-SODIUM CHLORIDE 30-0.9 UT/500ML-% IV SOLN
2.5000 [IU]/h | INTRAVENOUS | Status: DC
  Filled 2021-07-26: qty 500

## 2021-07-26 MED ORDER — ONDANSETRON HCL 4 MG PO TABS: 4.0000 mg | ORAL_TABLET | ORAL | Status: DC | PRN

## 2021-07-26 MED ORDER — LACTATED RINGERS IV SOLN
500.0000 mL | Freq: Once | INTRAVENOUS | Status: AC
  Administered 2021-07-26: 500 mL via INTRAVENOUS

## 2021-07-26 MED ORDER — MISOPROSTOL 25 MCG QUARTER TABLET
25.0000 ug | ORAL_TABLET | ORAL | Status: DC | PRN
  Administered 2021-07-26 (×2): 25 ug via VAGINAL
  Filled 2021-07-26 (×2): qty 1

## 2021-07-26 MED ORDER — LIDOCAINE HCL (PF) 1 % IJ SOLN: 30.0000 mL | INTRAMUSCULAR | Status: DC | PRN

## 2021-07-26 MED ORDER — OXYCODONE-ACETAMINOPHEN 5-325 MG PO TABS: 2.0000 | ORAL_TABLET | ORAL | Status: DC | PRN

## 2021-07-26 MED ORDER — ONDANSETRON HCL 4 MG/2ML IJ SOLN: 4.0000 mg | INTRAMUSCULAR | Status: DC | PRN

## 2021-07-26 NOTE — Progress Notes (Signed)
This nurse was called by pharmacy to check on patients reactions to toradol and if she can have ibuprofen. Pt stated that she CAN have ibuprofen it is safe she only has a reaction to toradol specifically. Pt stated that toradol causes her body to shut down with tunnel vision her body can't move. Nurse reported this to Brantley Stage RN at shift Clearance Coots stated that she would call pharmacy to let them know that ibuprofen is ok per pt.  ?

## 2021-07-26 NOTE — Anesthesia Preprocedure Evaluation (Signed)
Anesthesia Evaluation  ?Patient identified by MRN, date of birth, ID band ?Patient awake ? ? ? ?Reviewed: ?Allergy & Precautions, Patient's Chart, lab work & pertinent test results ? ?History of Anesthesia Complications ?(+) PONV and history of anesthetic complications ? ?Airway ?Mallampati: II ? ?TM Distance: >3 FB ?Neck ROM: Full ? ? ? Dental ?no notable dental hx. ? ?  ?Pulmonary ?neg pulmonary ROS,  ?  ?Pulmonary exam normal ? ? ? ? ? ? ? Cardiovascular ?negative cardio ROS ?Normal cardiovascular exam ? ? ?  ?Neuro/Psych ? Headaches, negative psych ROS  ? GI/Hepatic ?negative GI ROS, Neg liver ROS,   ?Endo/Other  ?negative endocrine ROS ? Renal/GU ?negative Renal ROS  ?negative genitourinary ?  ?Musculoskeletal ?negative musculoskeletal ROS ?(+)  ? Abdominal ?  ?Peds ? Hematology ?negative hematology ROS ?(+)   ?Anesthesia Other Findings ?Ehlers-Danlos disease ? Reproductive/Obstetrics ?(+) Pregnancy ? ?  ? ? ? ? ? ? ? ? ? ? ? ? ? ?  ?  ? ? ? ? ? ? ? ? ?Anesthesia Physical ?Anesthesia Plan ? ?ASA: 2 ? ?Anesthesia Plan: Epidural  ? ?Post-op Pain Management:   ? ?Induction:  ? ?PONV Risk Score and Plan: Treatment may vary due to age or medical condition ? ?Airway Management Planned: Natural Airway ? ?Additional Equipment: Fetal Monitoring ? ?Intra-op Plan:  ? ?Post-operative Plan:  ? ?Informed Consent: I have reviewed the patients History and Physical, chart, labs and discussed the procedure including the risks, benefits and alternatives for the proposed anesthesia with the patient or authorized representative who has indicated his/her understanding and acceptance.  ? ? ? ? ? ?Plan Discussed with:  ? ?Anesthesia Plan Comments:   ? ? ? ? ? ? ?Anesthesia Quick Evaluation ? ?

## 2021-07-26 NOTE — Lactation Note (Signed)
This note was copied from a baby's chart. ?Lactation Consultation Note ?LC presented to L&D to assist with first bf'ing. Care team still in room and not ready for service.  ? ?Patient Name: Debbie Christensen ?Today's Date: 07/26/2021 ?  ?Age:27 hours ? ? ?[name] ?07/26/2021, 4:33 PM ? ? ? ?

## 2021-07-26 NOTE — Progress Notes (Signed)
Patient assisted to [place] in a stedy around 2120 & when she stood up she c/o "feeling dizzy & feel like passing out". Assisted back to commode, eyes are closed but verbally responsive. Ammonia inhalation provided, patient became more alert. Assisted back to bed in a stedy. Bedside commode provided & instructed to call for assistance.VSS, will continue to monitor. No complaints @ this time. ?

## 2021-07-26 NOTE — H&P (Signed)
25 y.o. G1P0 @ [redacted]w[redacted]d presents for elective induction of labor.  Otherwise has good fetal movement and no bleeding.  She received two doses of misoprostol overnight, last dose at 0540.  Is now contracting painfully and desires an epidural ? ?Pregnancy complicated by: ? Ehlers-Danlos syndrome: normal maternal ECHO this pregnancy ?H/o provoked DVT following orthopedic surgery.  ?Failed 1hr gtt, passed 3hr gtt ? ?Past Medical History:  ?Diagnosis Date  ? EDS (Ehlers-Danlos syndrome)   ? Ehlers-Danlos disease   ? Kidney calculi   ? Migraines   ? Pineal gland cyst   ? PONV (postoperative nausea and vomiting)   ? Raynaud's disease   ? Renal disorder   ?  ?Past Surgical History:  ?Procedure Laterality Date  ? FOOT SURGERY Right   ? KNEE ARTHROSCOPY    ? 8 bilateral knee surgeries  ? KNEE ARTHROSCOPY    ? TONSILLECTOMY    ? WISDOM TOOTH EXTRACTION    ?  ?OB History  ?Gravida Para Term Preterm AB Living  ?1            ?SAB IAB Ectopic Multiple Live Births  ?           ?  ?# Outcome Date GA Lbr Len/2nd Weight Sex Delivery Anes PTL Lv  ?1 Current           ?  ?Social History  ? ?Socioeconomic History  ? Marital status: Married  ?  Spouse name: Madalina Olear  ? Number of children: Not on file  ? Years of education: Not on file  ? Highest education level: Not on file  ?Occupational History  ? Not on file  ?Tobacco Use  ? Smoking status: Never  ? Smokeless tobacco: Never  ?Vaping Use  ? Vaping Use: Never used  ?Substance and Sexual Activity  ? Alcohol use: Never  ? Drug use: Never  ? Sexual activity: Yes  ?Other Topics Concern  ? Not on file  ?Social History Narrative  ? ** Merged History Encounter **  ?    ? ?Social Determinants of Health  ? ?Financial Resource Strain: Not on file  ?Food Insecurity: Not on file  ?Transportation Needs: Not on file  ?Physical Activity: Not on file  ?Stress: Not on file  ?Social Connections: Not on file  ?Intimate Partner Violence: Not on file  ? Cinnamon, Ketorolac, and Tape  ? ? ?Prenatal  Transfer Tool  ?Maternal Diabetes: No ?Genetic Screening: Normal ?Maternal Ultrasounds/Referrals: Normal ?Fetal Ultrasounds or other Referrals:  None ?Maternal Substance Abuse:  No ?Significant Maternal Medications:  None ?Significant Maternal Lab Results: Group B Strep positive ? ?ABO, Rh: --/--/B POS (04/21 0101) ?Antibody: NEG (04/21 0101) ?Rubella: Immune (10/03 0000) ?RPR: Nonreactive (10/03 0000)  ?HBsAg: Negative (10/03 0000)  ?HIV: Non-reactive (10/03 0000)  ?GBS: Positive/-- (04/04 0000)  ? ? ?Vitals:  ? 07/26/21 0156 07/26/21 0538  ?BP:  91/67  ?Pulse:  82  ?Resp:  20  ?Temp: 98.3 ?F (36.8 ?C) 97.8 ?F (36.6 ?C)  ?  ? ?General:  NAD ?Abdomen:  soft, gravid, EFW 7.5-8# ?Ex:  1+ edema ?SVE:  5/90/-2 ?FHTs:  150s, moderate variability ?Toco:  q2-3 minutes ? ? ?A/P   26 y.o. G1P0 [redacted]w[redacted]d presents for elective induction of labor ?S/p misoprostol x 2 and SROM approximately 30 minutes ago.  Entering active lab ?Epidural now.  Pitocin as needed ? ?FSR/ vtx/ GBS positive ? ?[place] ? ?

## 2021-07-26 NOTE — Anesthesia Procedure Notes (Signed)
Epidural ?Patient location during procedure: OB ?Start time: 07/26/2021 10:04 AM ?End time: 07/26/2021 10:07 AM ? ?Staffing ?Anesthesiologist: Kaylyn Layer, MD ?Performed: anesthesiologist  ? ?Preanesthetic Checklist ?Completed: patient identified, IV checked, risks and benefits discussed, monitors and equipment checked, pre-op evaluation and timeout performed ? ?Epidural ?Patient position: sitting ?Prep: DuraPrep and site prepped and draped ?Patient monitoring: continuous pulse ox, blood pressure and heart rate ?Approach: midline ?Location: L3-L4 ?Injection technique: LOR air ? ?Needle:  ?Needle type: Tuohy  ?Needle gauge: 17 G ?Needle length: 9 cm ?Needle insertion depth: 6 cm ?Catheter type: closed end flexible ?Catheter size: 19 Gauge ?Catheter at skin depth: 11 cm ?Test dose: negative and Other (1% lidocaine) ? ?Assessment ?Events: blood not aspirated, injection not painful, no injection resistance, no paresthesia and negative IV test ? ?Additional Notes ?Patient identified. Risks, benefits, and alternatives discussed with patient including but not limited to bleeding, infection, nerve damage, paralysis, failed block, incomplete pain control, headache, blood pressure changes, nausea, vomiting, reactions to medication, itching, and postpartum back pain. Confirmed with bedside nurse the patient's most recent platelet count. Confirmed with patient that they are not currently taking any anticoagulation, have any bleeding history, or any family history of bleeding disorders. Patient expressed understanding and wished to proceed. All questions were answered. Sterile technique was used throughout the entire procedure. Please see nursing notes for vital signs.  ? ?Crisp LOR on first pass. Test dose was given through epidural catheter and negative prior to continuing to dose epidural or start infusion. Warning signs of high block given to the patient including shortness of breath, tingling/numbness in hands, complete  motor block, or any concerning symptoms with instructions to call for help. Patient was given instructions on fall risk and not to get out of bed. All questions and concerns addressed with instructions to call with any issues or inadequate analgesia.  Reason for block:procedure for pain ? ? ? ?

## 2021-07-27 LAB — CBC
HCT: 23.8 % — ABNORMAL LOW (ref 36.0–46.0)
Hemoglobin: 7.8 g/dL — ABNORMAL LOW (ref 12.0–15.0)
MCH: 28.7 pg (ref 26.0–34.0)
MCHC: 32.8 g/dL (ref 30.0–36.0)
MCV: 87.5 fL (ref 80.0–100.0)
Platelets: 160 10*3/uL (ref 150–400)
RBC: 2.72 MIL/uL — ABNORMAL LOW (ref 3.87–5.11)
RDW: 14.7 % (ref 11.5–15.5)
WBC: 13.6 10*3/uL — ABNORMAL HIGH (ref 4.0–10.5)
nRBC: 0 % (ref 0.0–0.2)

## 2021-07-27 LAB — FIBRINOGEN: Fibrinogen: 504 mg/dL — ABNORMAL HIGH (ref 210–475)

## 2021-07-27 LAB — PROTIME-INR
INR: 0.9 (ref 0.8–1.2)
Prothrombin Time: 12 seconds (ref 11.4–15.2)

## 2021-07-27 LAB — HEMOGLOBIN AND HEMATOCRIT, BLOOD
HCT: 27.1 % — ABNORMAL LOW (ref 36.0–46.0)
Hemoglobin: 8.6 g/dL — ABNORMAL LOW (ref 12.0–15.0)

## 2021-07-27 LAB — APTT: aPTT: 25 seconds (ref 24–36)

## 2021-07-27 LAB — PREPARE RBC (CROSSMATCH)

## 2021-07-27 LAB — ABO/RH: ABO/RH(D): B POS

## 2021-07-27 MED ORDER — SODIUM CHLORIDE 0.9% IV SOLUTION: Freq: Once | INTRAVENOUS | Status: AC

## 2021-07-27 MED ORDER — DIPHENHYDRAMINE HCL 25 MG PO CAPS
25.0000 mg | ORAL_CAPSULE | Freq: Once | ORAL | Status: AC
  Administered 2021-07-27: 25 mg via ORAL
  Filled 2021-07-27: qty 1

## 2021-07-27 NOTE — Lactation Note (Signed)
This note was copied from a baby's chart. ?Lactation Consultation Note ? ?Patient Name: Debbie Christensen ?Today's Date: 07/27/2021 ?Reason for consult: Follow-up assessment;Term;Primapara;1st time breastfeeding ?Age:27 hours ? ? ?P1 mother whose infant is now 23 hours old.  This is a term baby at 39+1 weeks.  Mother's current feeding preference is breast. ? ?Mother requested latch assistance. ? ?Baby "Debbie Christensen" was asleep in father's arms when I arrived.  Reviewed hand expression and mother able to express a few drops.  Latched easily in the football hold on the right breast.  Observed him feeding well for 17 minutes with intermittent stimulation.  Continued to review basic breast feeding concepts with parents.  Father had lots of questions.  Both parents are eager to learn. ? ?Mother will continue to feed on cue or at least 8-12 times/24 hours.  Baby is voiding/stooling.  She will call her RN/LC for latch assistance as needed.  RN in room at the end of my visit for blood transfusion.  Encouraged rest during this time and adequate nutrition and hydration.  Mother verbalized understanding.  Demonstrated swaddling per father's request.   ? ? ?Maternal Data ?Has patient been taught Hand Expression?: Yes ?Does the patient have breastfeeding experience prior to this delivery?: No ? ?Feeding ?Mother's Current Feeding Choice: Breast Milk ? ?LATCH Score ?Latch: Grasps breast easily, tongue down, lips flanged, rhythmical sucking. ? ?Audible Swallowing: A few with stimulation ? ?Type of Nipple: Everted at rest and after stimulation ? ?Comfort (Breast/Nipple): Soft / non-tender ? ?Hold (Positioning): Assistance needed to correctly position infant at breast and maintain latch. ? ?LATCH Score: 8 ? ? ?Lactation Tools Discussed/Used ?Tools: Shells;Pump;Flanges;Coconut oil ?Flange Size: 24 ?Breast pump type: Manual ?Pump Education: Milk Storage;Setup, frequency, and cleaning (Reviewed) ?Reason for Pumping: Nipple eversion ?Pumping  frequency: PRN ? ?Interventions ?Interventions: Breast feeding basics reviewed;Assisted with latch;Skin to skin;Breast massage;Hand express;Pre-pump if needed;Breast compression;Hand pump;Shells;Expressed milk;Coconut oil;Position options;Support pillows;Adjust position;DEBP;Education ? ?Discharge ?Pump: Personal ([place]) ? ?Consult Status ?Consult Status: Follow-up ?Date: 07/28/21 ?Follow-up type: In-patient ? ? ? ?Jevin Camino R Martin Belling ?07/27/2021, 12:49 PM ? ? ? ?

## 2021-07-27 NOTE — Anesthesia Postprocedure Evaluation (Signed)
Anesthesia Post Note ? ?Patient: [name] ? ?Procedure(s) Performed: AN AD HOC LABOR EPIDURAL ? ?  ? ?Patient location during evaluation: Mother Baby ?Anesthesia Type: Epidural ?Level of consciousness: awake and alert ?Pain management: pain level controlled ?Vital Signs Assessment: post-procedure vital signs reviewed and stable ?Respiratory status: spontaneous breathing, nonlabored ventilation and respiratory function stable ?Cardiovascular status: stable ?Postop Assessment: no headache, no backache and epidural receding ?Anesthetic complications: no ? ? ?No notable events documented. ? ?Last Vitals:  ?Vitals:  ? 07/27/21 0019 07/27/21 0402  ?BP: 126/82 112/63  ?Pulse: (!) 111 84  ?Resp: 18 16  ?Temp: 36.6 ?C 36.6 ?C  ?SpO2: 99% 99%  ?  ?Last Pain:  ?Vitals:  ? 07/27/21 0402  ?TempSrc: Oral  ?PainSc:   ? ?Pain Goal:   ? ?  ?  ?  ?  ?  ?  ?  ? ?Debbie Christensen ? ? ? ? ?

## 2021-07-27 NOTE — Progress Notes (Signed)
Post Partum Day 1 ?Subjective: ?up ad lib, voiding, tolerating PO, + flatus, and lochia mild. Pt admits to bouts of dizziness and nausea; no vomiting. She reports pain well controlled with meds. Bonding well with baby - breastfeeding.  ? ?Objective: ?Blood pressure 112/63, pulse 84, temperature 97.8 ?F (36.6 ?C), temperature source Oral, resp. rate 16, height 5\' 4"  (1.626 m), weight 105 kg, last menstrual period 10/25/2020, SpO2 99 %. ? ?Physical Exam:  ?General: alert, cooperative, fatigued, and no distress ?Lochia: appropriate ?Uterine Fundus: firm ?Incision: n/a ?DVT Evaluation: No evidence of DVT seen on physical exam. ?Negative Homan's sign. ? ?Recent Labs  ?  07/26/21 ?0101 07/27/21 ?[id number]  ?HGB 10.4* 7.8*  ?HCT 32.6* 23.8*  ? ? ?Assessment/Plan: ?Plan for discharge tomorrow, Breastfeeding, and Circumcision prior to discharge ?Check orthostatic vitals - consider blood transfusion if indicated. Pt agrees ?Discussed circumcision today if ok per peds  ?Pt understands that neonatal circumcision is not considered medically necessary and is elective. The risks include, but are not limited to bleeding, infection, damage to the penis, development of scar tissue, and having to have it redone at a later date. Pt understands theses risks and wishes to proceed  ? ? LOS: 1 day  ? ?Isaiah Serge ?07/27/2021, 11:24 AM  ? ? ?

## 2021-07-27 NOTE — Lactation Note (Signed)
This note was copied from a baby's chart. ?Lactation Consultation Note ?Baby hasn't been to interested in BF. When [place] stimulating baby to wake up d/t time to feed, baby noted to trying to spit up but didn't, swallowing trying not to spit up. ?Mom's nipples look red. Mom stated tender. Mom has Raynaud's. Placed baby in football position for deeper latch. At first it hurt then mom stated it felt better. ?Baby BF about 7 minutes then fell asleep. ?LC gave mom hand pump for pre-pumping before latching. ?Shells to wear today. ?Newborn feeding habits, STS, I&O, positioning, support, supply and demand reviewed. ? Mom encouraged to feed baby 8-12 times/24 hours and with feeding cues.   ?Encouraged mom to call for assistance as needed. ?Mom hasn't been feeling to well since delivery. ? ?Patient Name: Debbie Christensen ?Today's Date: 07/27/2021 ?Reason for consult: Initial assessment;Primapara ?Age:60 hours ? ?Maternal Data ?Has patient been taught Hand Expression?: Yes ?Does the patient have breastfeeding experience prior to this delivery?: No ? ?Feeding ?  ? ?LATCH Score ?Latch: Grasps breast easily, tongue down, lips flanged, rhythmical sucking. ? ?Audible Swallowing: None ? ?Type of Nipple: Flat ? ?Comfort (Breast/Nipple): Filling, red/small blisters or bruises, mild/mod discomfort (red and tender) ? ?Hold (Positioning): Assistance needed to correctly position infant at breast and maintain latch. ? ?LATCH Score: 5 ? ? ?Lactation Tools Discussed/Used ?Tools: Pump;Shells ?Breast pump type: Manual ?Pump Education: Setup, frequency, and cleaning;Milk Storage ?Reason for Pumping: flat nipples ?Pumping frequency: pre-pump ? ?Interventions ?Interventions: Breast feeding basics reviewed;Assisted with latch;Skin to skin;Breast massage;Hand express;Pre-pump if needed;Breast compression;Adjust position;Support pillows;Position options;Shells;Hand pump;LC Services brochure ? ?Discharge ?  ? ?Consult Status ?Consult Status:  Follow-up ?Date: 07/27/21 ?Follow-up type: In-patient ? ? ? ?Theodoro Kalata ?07/27/2021, 4:40 AM ? ? ? ?

## 2021-07-27 NOTE — Progress Notes (Signed)
Patient ID: Debbie Christensen, female   DOB: June 30, 1995, 26 y.o.   MRN: [id number] ?Reviewed results from orthostatic vitals . Pt turned pale during check per nursing.  ? ?Will order 1uprbcs now ?

## 2021-07-28 ENCOUNTER — Encounter (HOSPITAL_COMMUNITY): Payer: Self-pay | Admitting: *Deleted

## 2021-07-28 LAB — TYPE AND SCREEN
ABO/RH(D): B POS
Antibody Screen: NEGATIVE
Unit division: 0

## 2021-07-28 LAB — BPAM RBC
Blood Product Expiration Date: 202305142359
ISSUE DATE / TIME: 202304221300
Unit Type and Rh: 7300

## 2021-07-28 MED ORDER — FERROUS SULFATE 325 (65 FE) MG PO TABS
325.0000 mg | ORAL_TABLET | Freq: Every day | ORAL | 1 refills | Status: AC
Start: 2021-07-28 — End: ?
  Filled 2021-07-28: qty 90, 90d supply, fill #0

## 2021-07-28 MED ORDER — IBUPROFEN 600 MG PO TABS
600.0000 mg | ORAL_TABLET | Freq: Four times a day (QID) | ORAL | 1 refills | Status: AC | PRN
  Filled 2021-07-28: qty 40, 10d supply, fill #0

## 2021-07-28 NOTE — Progress Notes (Signed)
Post Partum Day 2 ?Subjective: ?up ad lib, voiding, tolerating PO, + flatus, and lochia mild. Pt reports feels better after received transfusion last night - more energy, less dizziness. She is bonding well with baby - clusterfeeding last night so fatigued. No HA, blurry vision, SOB or CP.  Has questions about expectations at home  ? ?Objective: ?Blood pressure 118/84, pulse 97, temperature 98.4 ?F (36.9 ?C), temperature source Oral, resp. rate 16, height 5\' 4"  (1.626 m), weight 105 kg, last menstrual period 10/25/2020, SpO2 97 %. ? ?Physical Exam:  ?General: alert, cooperative, and no distress ?Lochia: appropriate ?Uterine Fundus: firm ?Incision: n/a ?DVT Evaluation: No evidence of DVT seen on physical exam. ? ?Recent Labs  ?  07/27/21 ?0456 07/27/21 ?2000  ?HGB 7.8* 8.6*  ?HCT 23.8* 27.1*  ? ? ?Assessment/Plan: ?Discharge home, Breastfeeding, and Circumcision prior to discharge ?Anemia improved ?F/U in 6 weeks  ? ? LOS: 2 days  ? ?07/29/21 ?07/28/2021, 8:10 AM  ? ? ?

## 2021-07-28 NOTE — Discharge Instructions (Signed)
Call office with any concerns [phone] 

## 2021-07-28 NOTE — Discharge Summary (Signed)
? ?  Postpartum Discharge Summary ? ?Date of Service updated  ? ?   ?Patient Name: Debbie Christensen ?DOB: 1995-12-04 ?MRN: [id number] ? ?Date of admission: 07/26/2021 ?Delivery date:07/26/2021  ?Delivering provider: Marlow Baars  ?Date of discharge: 07/28/2021 ? ?Admitting diagnosis: Pregnancy [Z34.90] ?Intrauterine pregnancy: [redacted]w[redacted]d     ?Secondary diagnosis:  Principal Problem: ?  Pregnancy ? ?Additional problems: Anemia due to acute blood loss     ?Discharge diagnosis: Term Pregnancy Delivered and Anemia                                              ?Post partum procedures: n/a ?Augmentation: Pitocin and Cytotec ?Complications: None ? ?Hospital course: Induction of Labor With Vaginal Delivery   ?26 y.o. yo G1P0 at [redacted]w[redacted]d was admitted to the hospital 07/26/2021 for induction of labor.  Indication for induction: Elective.  Patient had an uncomplicated labor course as follows: ?Membrane Rupture Time/Date: 9:05 AM ,07/26/2021   ?Delivery Method:Vaginal, Spontaneous  ?Episiotomy: None  ?Lacerations:  2nd degree;Sulcus  ?Details of delivery can be found in separate delivery note.  Patient had a routine postpartum course. Patient is discharged home 07/28/21. ? ?Newborn Data: ?Birth date:07/26/2021  ?Birth time:3:53 PM  ?Gender:Female  ?Living status:Living  ?Apgars:9 ,9  ?Weight:3830 g  ? ?Magnesium Sulfate received: No ?BMZ received: No ?Transfusion:Yes ? ?Physical exam  ?Vitals:  ? 07/27/21 1449 07/27/21 1621 07/27/21 1945 07/28/21 0510  ?BP: 117/78 124/83 115/73 118/84  ?Pulse: 95 (!) 104 (!) 108 97  ?Resp: 16 18 16 16   ?Temp:  98.1 ?F (36.7 ?C) 98.2 ?F (36.8 ?C) 98.4 ?F (36.9 ?C)  ?TempSrc:  Oral Oral Oral  ?SpO2: 98% 97%    ?Weight:      ?Height:      ? ?General: alert, cooperative, and no distress ?Lochia: appropriate ?Uterine Fundus: firm ?Incision: N/A ?DVT Evaluation: No evidence of DVT seen on physical exam. ?Labs: ?Lab Results  ?Component Value Date  ? WBC 13.6 (H) 07/27/2021  ? HGB 8.6 (L) 07/27/2021  ? HCT 27.1 (L)  07/27/2021  ? MCV 87.5 07/27/2021  ? PLT 160 07/27/2021  ? ? ?  Latest Ref Rng & Units 11/02/2019  ? 10:07 PM  ?CMP  ?Glucose 70 - 99 mg/dL 88    ?BUN 6 - 20 mg/dL 17    ?Creatinine 0.44 - 1.00 mg/dL 11/04/2019    ?Sodium 135 - 145 mmol/L 139    ?Potassium 3.5 - 5.1 mmol/L 3.5    ?Chloride 98 - 111 mmol/L 106    ?CO2 22 - 32 mmol/L 24    ?Calcium 8.9 - 10.3 mg/dL 9.4    ? ?Edinburgh Score: ? ?  07/27/2021  ? 12:19 AM  ?07/29/2021 Postnatal Depression Scale Screening Tool  ?I have been able to laugh and see the funny side of things. 0  ?I have looked forward with enjoyment to things. 0  ?I have blamed myself unnecessarily when things went wrong. 1  ?I have been anxious or worried for no good reason. 0  ?I have felt scared or panicky for no good reason. 0  ?Things have been getting on top of me. 1  ?I have been so unhappy that I have had difficulty sleeping. 1  ?I have felt sad or miserable. 0  ?I have been so unhappy that I have been crying. 1  ?The thought of harming  myself has occurred to me. 0  ?Edinburgh Postnatal Depression Scale Total 4  ? ? ? ? ?After visit meds:  ?Allergies as of 07/28/2021   ? ?   Reactions  ? Cinnamon Other (See Comments)  ? Ketorolac Other (See Comments)  ? Had trouble seeing and talking post injection ?Pt states she can tolerate ibuprofen  ? Tape Hives  ? All adhesives  ? ?  ? ?  ?Medication List  ?  ? ?TAKE these medications   ? ?ferrous sulfate 325 (65 FE) MG tablet ?Take 1 tablet (325 mg total) by mouth daily with breakfast. ?  ?ibuprofen 600 MG tablet ?Commonly known as: ADVIL ?Take 1 tablet (600 mg total) by mouth every 6 (six) hours as needed for moderate pain or cramping. ?  ?lidocaine 5 % ?Commonly known as: Lidoderm ?Place 1 patch onto the skin daily. Remove & Discard patch within 12 hours or as directed by MD ?  ?ondansetron 4 MG disintegrating tablet ?Commonly known as: ZOFRAN-ODT ?Take 4 mg by mouth every 8 (eight) hours as needed for nausea or vomiting. ?  ?prenatal multivitamin Tabs  tablet ?Take 1 tablet by mouth daily at 12 noon. ?  ? ?  ? ? ? ?Discharge home in stable condition ?Infant Feeding: Breast ?Infant Disposition:home with mother ?Discharge instruction: per After Visit Summary and Postpartum booklet. ?Activity: Advance as tolerated. Pelvic rest for 6 weeks.  ?Diet: routine diet ?Anticipated Birth Control: Unsure ?Postpartum Appointment:6 weeks ?Additional Postpartum F/U: Postpartum Depression checkup ?Future Appointments:No future appointments. ?Follow up Visit: ? Follow-up Information   ? ? Ob/Gyn, Nestor Ramp. Schedule an appointment as soon as possible for a visit in 6 week(s).   ?Why: For postpartum visit ?Contact information: ?[address] ?[address] ?[place] [address] ?[phone] ? ? ?  ?  ? ?  ?  ? ?  ? ? ? ?  ? ?07/28/2021 ?Cathrine Muster, DO ? ? ?

## 2021-07-28 NOTE — Lactation Note (Signed)
This note was copied from a baby's chart. ?Lactation Consultation Note ? ?Patient Name: Debbie Christensen ?Today's Date: 07/28/2021 ?Reason for consult: Follow-up assessment;Term;Primapara;1st time breastfeeding ?Age:27 hours ? ? ?Lactation Follow Up Consult: ? ?Family has been discharged.  Reviewed breast feeding basics and feeding plan for after discharge.  Mother reported that "Debbie Christensen" was up a lot during the night and she is tired now.  He also had a circumcision this morning and has remained sleepy.  Reassurance given.  Discussed the possibility of "Debbie Christensen" awakening more again tonight for feedings.  Mother verbalized understanding. ? ?Parents with no further questions.  They have our OP phone number for any concerns after discharge.  Father very supportive and assisting with newborn care. ? ? ?Maternal Data ?  ? ?Feeding ?Mother's Current Feeding Choice: Breast Milk ? ?LATCH Score ?  ? ?  ? ?  ? ?  ? ?  ? ?  ? ? ?Lactation Tools Discussed/Used ?  ? ?Interventions ?Interventions: Breast feeding basics reviewed;Education ? ?Discharge ?Discharge Education: Engorgement and breast care ?Pump: Personal ? ?Consult Status ?Consult Status: Complete ?Date: 07/28/21 ?Follow-up type: Call as needed ? ? ? ?Kaci Dillie R Doral Digangi ?07/28/2021, 12:23 PM ? ? ? ?

## 2021-07-29 ENCOUNTER — Other Ambulatory Visit (HOSPITAL_COMMUNITY): Payer: Self-pay

## 2021-08-05 ENCOUNTER — Telehealth (HOSPITAL_COMMUNITY): Payer: Self-pay | Admitting: *Deleted

## 2021-08-05 NOTE — Telephone Encounter (Signed)
Left phone voicemail message. ? ?Duffy Rhody, RN 08-05-2021 at 1:52pm ?

## 2021-10-20 ENCOUNTER — Emergency Department (HOSPITAL_BASED_OUTPATIENT_CLINIC_OR_DEPARTMENT_OTHER)
Admission: EM | Admit: 2021-10-20 | Discharge: 2021-10-20 | Disposition: A | Discharge status: Home / Self Care | Payer: BLUE CROSS/BLUE SHIELD | Attending: Emergency Medicine | Admitting: Emergency Medicine

## 2021-10-20 ENCOUNTER — Other Ambulatory Visit: Payer: Self-pay

## 2021-10-20 ENCOUNTER — Encounter (HOSPITAL_BASED_OUTPATIENT_CLINIC_OR_DEPARTMENT_OTHER): Payer: Self-pay | Admitting: Emergency Medicine

## 2021-10-20 DIAGNOSIS — M79645 Pain in left finger(s): Secondary | ICD-10-CM

## 2021-10-20 DIAGNOSIS — M25542 Pain in joints of left hand: Secondary | ICD-10-CM | POA: Insufficient documentation

## 2021-10-20 MED ORDER — LIDOCAINE HCL (PF) 1 % IJ SOLN
5.0000 mL | Freq: Once | INTRAMUSCULAR | Status: DC
Start: 2021-10-20 — End: 2021-10-20
  Filled 2021-10-20: qty 5

## 2021-10-20 NOTE — ED Triage Notes (Signed)
Pt is post partum 12 weeks, put her wedding rings on and now finger is swollen and wont come off.

## 2021-10-20 NOTE — ED Provider Notes (Signed)
[facility] EMERGENCY DEPT Provider Note   CSN: [id number] Arrival date & time: 10/20/21  4128     History  Chief Complaint  Patient presents with   finger swollen    Finger swollen    Debbie Christensen is a 26 y.o. female.  HPI     26 year old female with a history of Ehlers-Danlos disease, Raynaud's disease, who is 12 weeks postpartum, presents to concern for ring finger entrapped in wedding and engagement rings.  Reports she put on her wedding and engagement ring at 730 this morning.  She has she placed it, she found her rings difficult to remove, and is unable to remove them.  They report that at home they have tried several methods including using soap for lubrication, the string trick, elevation and icing.  She has numbness around the area where the ring is, and some diminished sensation to the tip.  Past Medical History:  Diagnosis Date   EDS (Ehlers-Danlos syndrome)    Ehlers-Danlos disease    Kidney calculi    Migraines    Pineal gland cyst    PONV (postoperative nausea and vomiting)    Raynaud's disease    Renal disorder     Home Medications Prior to Admission medications   Medication Sig Start Date End Date Taking? Authorizing Provider  ferrous sulfate 325 (65 FE) MG tablet Take 1 tablet (325 mg total) by mouth daily with breakfast. 07/28/21   Banga, Sharol Given, DO  ibuprofen (ADVIL) 600 MG tablet Take 1 tablet (600 mg total) by mouth every 6 (six) hours as needed for moderate pain or cramping. 07/28/21   Banga, Cecilia Worema, DO  lidocaine (LIDODERM) 5 % Place 1 patch onto the skin daily. Remove & Discard patch within 12 hours or as directed by MD 11/03/19   Nicanor Alcon, April, MD  ondansetron (ZOFRAN-ODT) 4 MG disintegrating tablet Take 4 mg by mouth every 8 (eight) hours as needed for nausea or vomiting.    [provider]  Prenatal Vit-Fe Fumarate-FA (PRENATAL MULTIVITAMIN) TABS tablet Take 1 tablet by mouth daily at 12 noon.    [provider]  Rivaroxaban 15 & 20 MG TBPK Follow package directions: Take one 15mg  tablet by mouth twice a day. On day 22, switch to one 20mg  tablet once a day. Take with food. 01/31/19 05/24/19  Petrucelli, 02/02/19, PA-C      Allergies    Cinnamon, Ketorolac, and Tape    Review of Systems   Review of Systems  Physical Exam Updated Vital Signs BP 109/68   Pulse 64   Temp 98.1 F (36.7 C) (Oral)   Resp 16   SpO2 97%   Breastfeeding No Comment: post partum , Physical Exam Vitals and nursing note reviewed.  Constitutional:      General: She is not in acute distress.    Appearance: Normal appearance. She is not ill-appearing, toxic-appearing or diaphoretic.  HENT:     Head: Normocephalic.  Eyes:     Conjunctiva/sclera: Conjunctivae normal.  Cardiovascular:     Rate and Rhythm: Normal rate and regular rhythm.     Pulses: Normal pulses.  Pulmonary:     Effort: Pulmonary effort is normal. No respiratory distress.  Musculoskeletal:        General: No deformity or signs of injury.     Cervical back: No rigidity.     Comments: Swelling of the left ring finger with 2 rings stuck at the base.  Capillary refill present, and finger darker  red in color.  Numbness immediately surrounding the ring, and decreased sensation to the tip of the finger.  Skin:    General: Skin is warm and dry.     Coloration: Skin is not jaundiced or pale.  Neurological:     General: No focal deficit present.     Mental Status: She is alert and oriented to person, place, and time.     ED Results / Procedures / Treatments   Labs (all labs ordered are listed, but only abnormal results are displayed) Labs Reviewed - No data to display  EKG None  Radiology No results found.  Procedures .Foreign Body Removal  Date/Time: 10/20/2021 11:36 PM  Performed by: Alvira Monday, MD Authorized by: Alvira Monday, MD  Consent: Verbal consent obtained. Patient identity confirmed: verbally with  patient Body area: skin Removal mechanism: ring cutter. Objects recovered: 2 rings      Medications Ordered in ED Medications - No data to display  ED Course/ Medical Decision Making/ A&P                           Medical Decision Making  26 year old female with a history of Ehlers-Danlos disease, Raynaud's disease, who is 12 weeks postpartum, presents to concern for ring finger entrapped in wedding and engagement rings.  She and husband had tried several methods of removal at home without success, and with degree of swelling do not feel intact removal is feasible.   Acknowledged that removal will involve cutting the ring but will release her fingers which are at increasing risk of vascular compromise.    Rings removed with ring cutter without complication.  Able to bend and extend finger, do not suspect fracture based on mechanism. Patient discharged in stable condition with understanding of reasons to return.         Final Clinical Impression(s) / ED Diagnoses Final diagnoses:  Pain of finger of left hand    Rx / DC Orders ED Discharge Orders     None         Alvira Monday, MD 10/20/21 2341

## 2021-10-20 NOTE — Discharge Instructions (Signed)
You had a ring removed from your ring finger. You have local swelling and numbness that should improve over time.  If you have worsening pain, color changes in your finger or other concerns, please return to the ED.

## 2021-11-13 ENCOUNTER — Other Ambulatory Visit: Payer: Self-pay

## 2022-04-22 IMAGING — CT CT ANGIO CHEST
2 of 6 series · 18 of 36 positions shown · IV contrast (omnipaque)
Comparison: Chest x-ray from earlier in the same day

CLINICAL DATA: Chest pain and shortness of breath

EXAM:
CT ANGIOGRAPHY CHEST WITH CONTRAST
TECHNIQUE: Multidetector CT imaging of the chest was performed using the
standard protocol during bolus administration of intravenous
contrast. Multiplanar CT image reconstructions and MIPs were
obtained to evaluate the vascular anatomy.
CONTRAST:  100mL OMNIPAQUE IOHEXOL 350 MG/ML SOLN

[Series 5: thins · axial · 0.71mm/px · z∈[+1329,+1569]mm · 17 of 270 slices shown]
[im 15/270  lung]
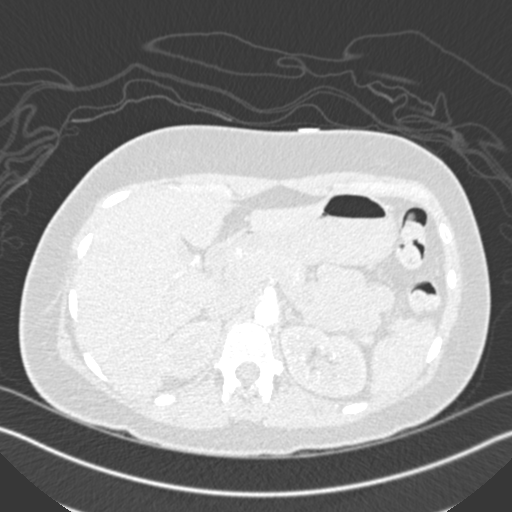
[im 30/270  mediastinal]
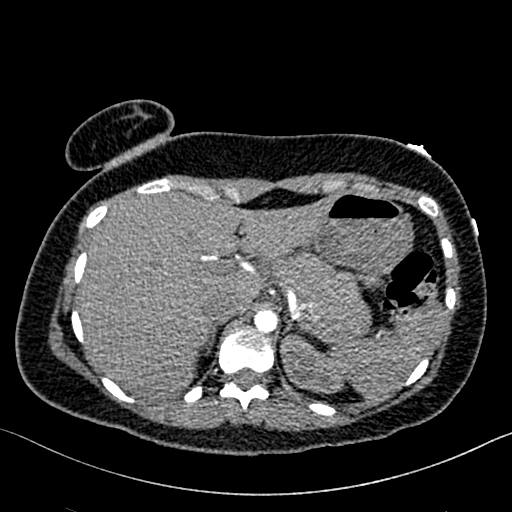
[im 45/270  lung]
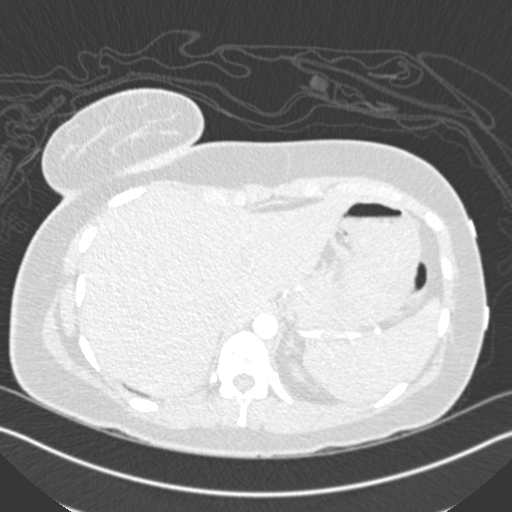
[im 60/270  mediastinal]
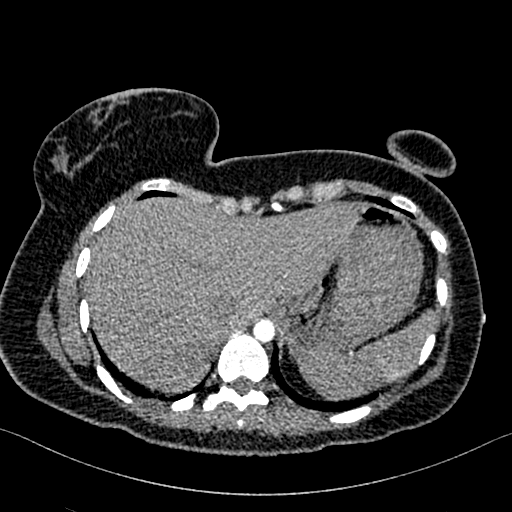
[im 75/270  lung]
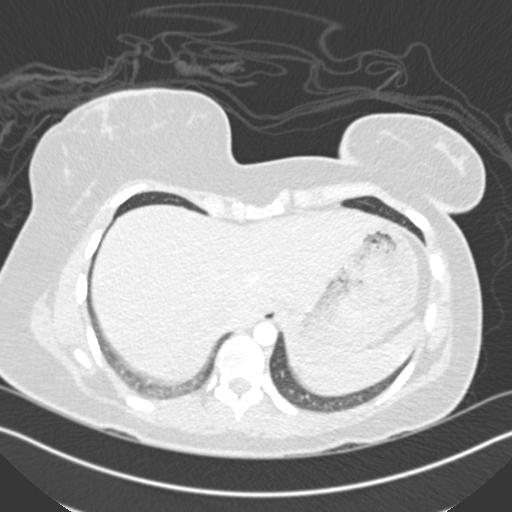
[im 90/270  mediastinal]
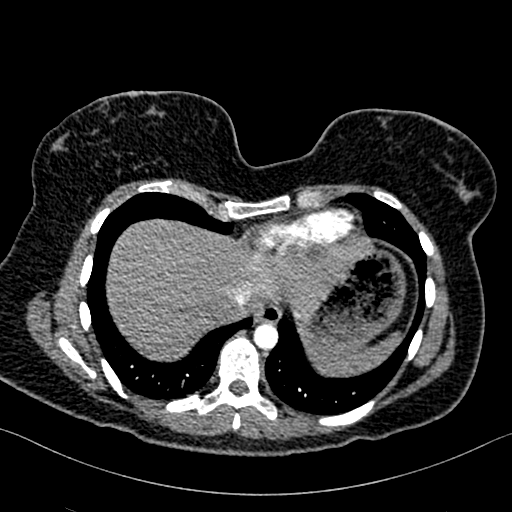
[im 105/270  lung]
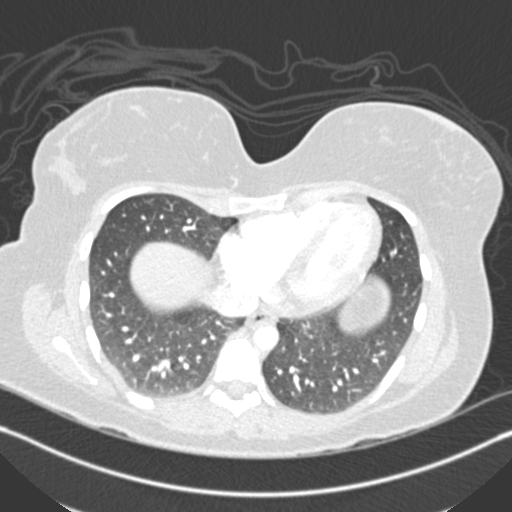
[im 120/270  mediastinal]
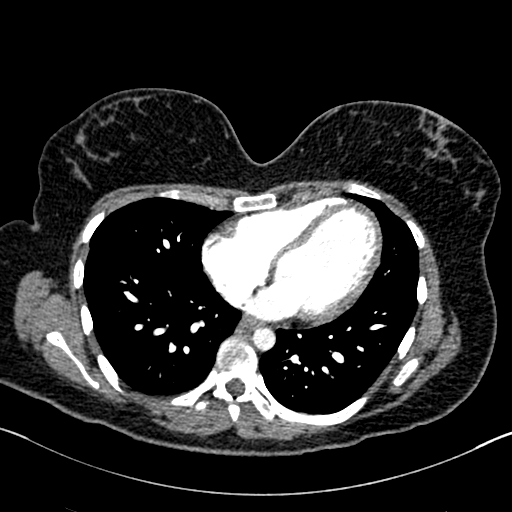
[im 135/270  lung]
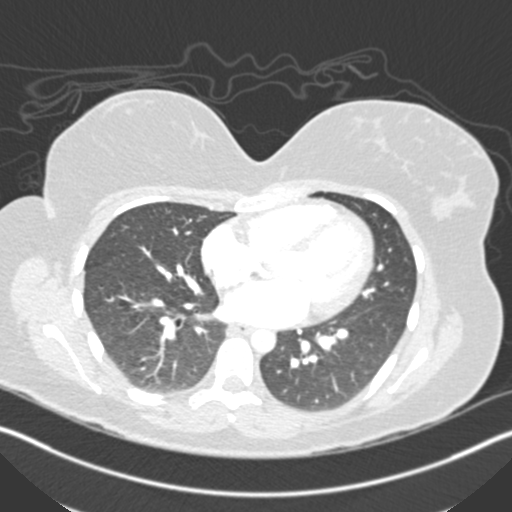
[im 150/270  mediastinal]
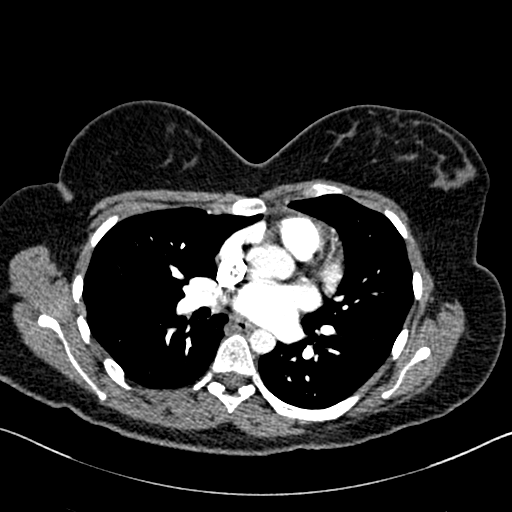
[im 165/270  lung]
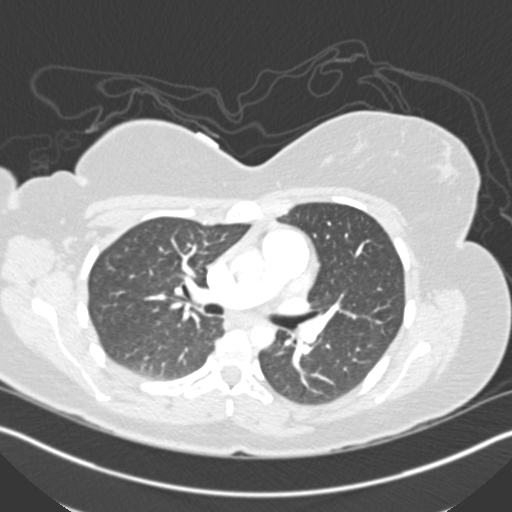
[im 180/270  mediastinal]
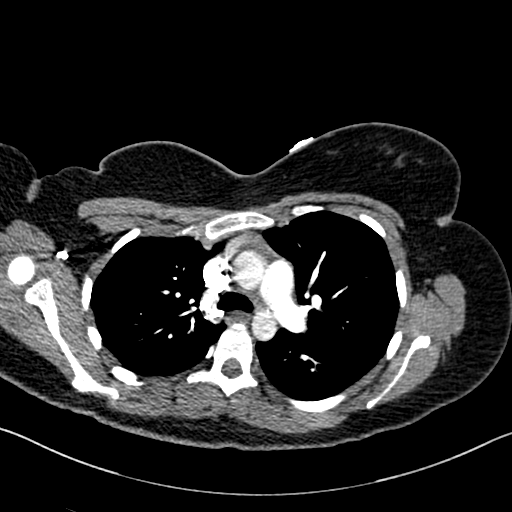
[im 195/270  lung]
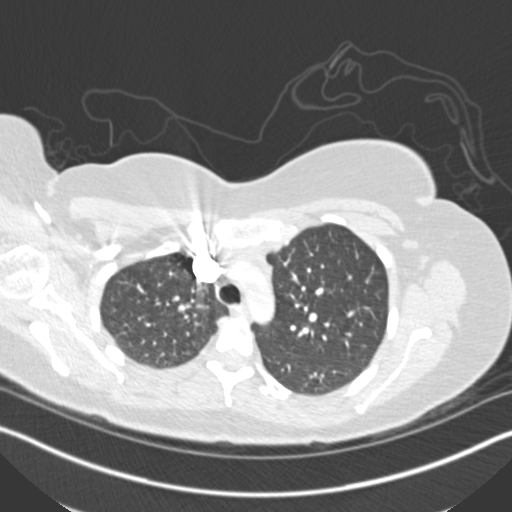
[im 210/270  mediastinal]
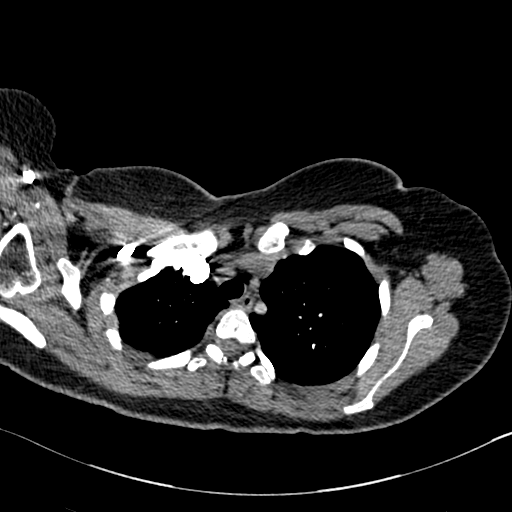
[im 225/270  lung]
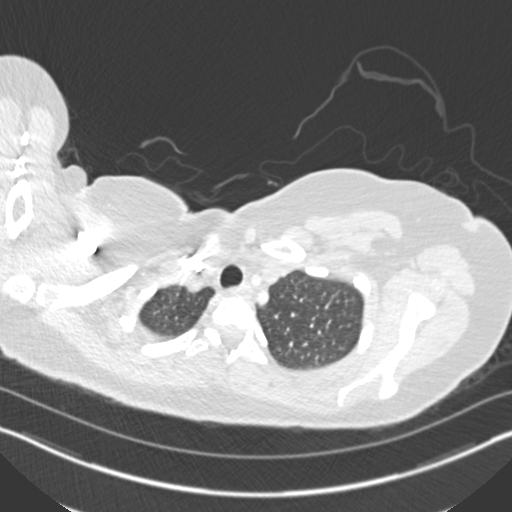
[im 240/270  mediastinal]
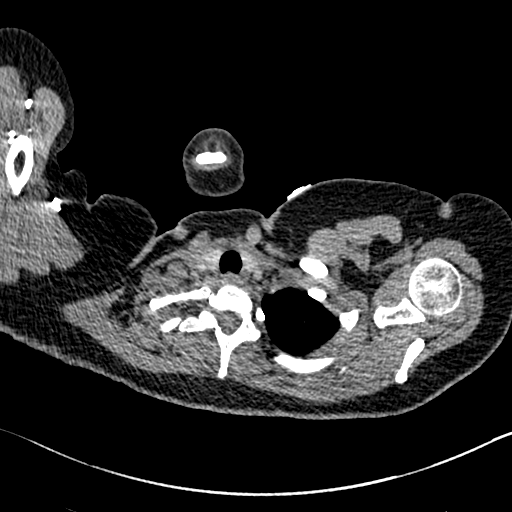
[im 255/270  lung]
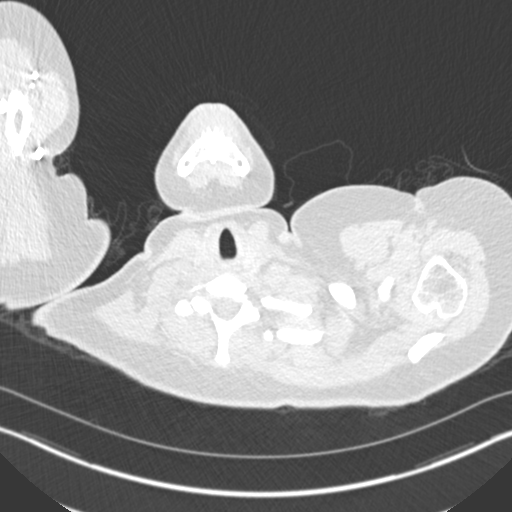

[Series 7: coronal mpr · coronal · 0.54mm/px · 1 of 125 slices shown]
[im 63/125  mediastinal]
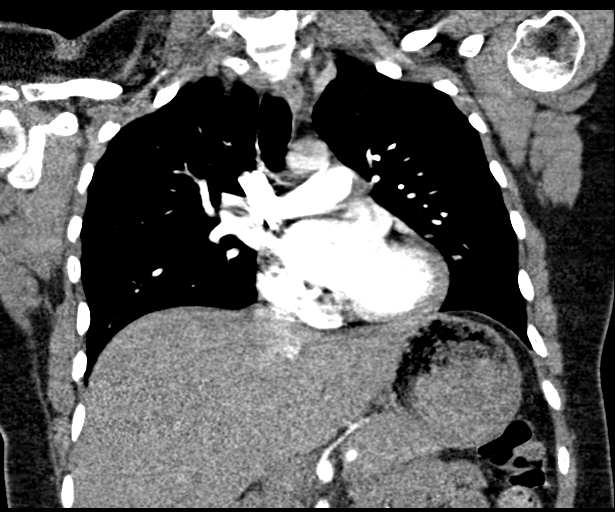

[18 of 36 positions shown; findings below may reference images not displayed]

FINDINGS: Cardiovascular: Thoracic aorta is incompletely opacified but within
normal limits without evidence aneurysmal dilatation or definitive
dissection. No cardiac enlargement is seen. The pulmonary artery
shows a normal branching pattern bilaterally. No filling defects are
noted to suggest pulmonary embolism.

Mediastinum/Nodes: Thoracic inlet is within normal limits. No hilar
or mediastinal adenopathy is noted. The esophagus as visualized is
within normal limits.

Lungs/Pleura: The lungs are well aerated bilaterally. No focal
infiltrate or sizable effusion is seen. No pneumothorax is noted. No
sizable parenchymal nodules are noted.

Upper Abdomen: Visualized upper abdomen is unremarkable.

Musculoskeletal: No chest wall abnormality. No acute or significant
osseous findings.

Review of the MIP images confirms the above findings.
IMPRESSION: No evidence of pulmonary emboli.

No acute abnormality noted.
